# Patient Record
Sex: Male | Born: 1940 | Race: White | Hispanic: No | Marital: Married | Smoking: Former smoker
Health system: Southern US, Community
[De-identification: ages and names within clinical notes are randomized; demographics above are authoritative.]

## PROBLEM LIST (undated history)

## (undated) DIAGNOSIS — J449 Chronic obstructive pulmonary disease, unspecified: Secondary | ICD-10-CM

## (undated) DIAGNOSIS — Z86010 Personal history of colonic polyps: Secondary | ICD-10-CM

## (undated) DIAGNOSIS — R6889 Other general symptoms and signs: Secondary | ICD-10-CM

## (undated) DIAGNOSIS — Z9289 Personal history of other medical treatment: Secondary | ICD-10-CM

## (undated) DIAGNOSIS — R911 Solitary pulmonary nodule: Secondary | ICD-10-CM

## (undated) DIAGNOSIS — Z85828 Personal history of other malignant neoplasm of skin: Secondary | ICD-10-CM

## (undated) DIAGNOSIS — I10 Essential (primary) hypertension: Secondary | ICD-10-CM

## (undated) DIAGNOSIS — Z860101 Personal history of adenomatous and serrated colon polyps: Secondary | ICD-10-CM

## (undated) DIAGNOSIS — Z972 Presence of dental prosthetic device (complete) (partial): Secondary | ICD-10-CM

## (undated) DIAGNOSIS — R310 Gross hematuria: Secondary | ICD-10-CM

## (undated) DIAGNOSIS — I779 Disorder of arteries and arterioles, unspecified: Secondary | ICD-10-CM

## (undated) DIAGNOSIS — Z973 Presence of spectacles and contact lenses: Secondary | ICD-10-CM

## (undated) DIAGNOSIS — D494 Neoplasm of unspecified behavior of bladder: Secondary | ICD-10-CM

## (undated) DIAGNOSIS — I2510 Atherosclerotic heart disease of native coronary artery without angina pectoris: Secondary | ICD-10-CM

## (undated) DIAGNOSIS — E785 Hyperlipidemia, unspecified: Secondary | ICD-10-CM

## (undated) DIAGNOSIS — K08109 Complete loss of teeth, unspecified cause, unspecified class: Secondary | ICD-10-CM

## (undated) DIAGNOSIS — N35911 Unspecified urethral stricture, male, meatal: Secondary | ICD-10-CM

## (undated) DIAGNOSIS — Z955 Presence of coronary angioplasty implant and graft: Secondary | ICD-10-CM

## (undated) DIAGNOSIS — Z9889 Other specified postprocedural states: Secondary | ICD-10-CM

## (undated) DIAGNOSIS — K5730 Diverticulosis of large intestine without perforation or abscess without bleeding: Secondary | ICD-10-CM

## (undated) HISTORY — PX: VASECTOMY: SHX75

## (undated) HISTORY — DX: Essential (primary) hypertension: I10

## (undated) HISTORY — PX: COLONOSCOPY: SHX174

## (undated) HISTORY — DX: Hyperlipidemia, unspecified: E78.5

## (undated) HISTORY — PX: CARDIOVASCULAR STRESS TEST: SHX262

## (undated) HISTORY — PX: CORONARY ANGIOPLASTY: SHX604

## (undated) HISTORY — DX: Atherosclerotic heart disease of native coronary artery without angina pectoris: I25.10

## (undated) HISTORY — PX: CORONARY ANGIOPLASTY WITH STENT PLACEMENT: SHX49

---
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
## 1969-08-29 HISTORY — PX: APPENDECTOMY: SHX54

## 1999-03-30 DIAGNOSIS — Z955 Presence of coronary angioplasty implant and graft: Secondary | ICD-10-CM

## 1999-03-30 HISTORY — DX: Presence of coronary angioplasty implant and graft: Z95.5

## 1999-04-06 ENCOUNTER — Observation Stay (HOSPITAL_COMMUNITY): Admission: AD | Admit: 1999-04-06 | Discharge: 1999-04-07 | Payer: Self-pay | Admitting: Cardiology

## 1999-08-02 ENCOUNTER — Ambulatory Visit (HOSPITAL_COMMUNITY): Admission: RE | Admit: 1999-08-02 | Discharge: 1999-08-03 | Payer: Self-pay | Admitting: Cardiology

## 1999-10-01 ENCOUNTER — Encounter: Admission: RE | Admit: 1999-10-01 | Discharge: 1999-10-01 | Payer: Self-pay | Admitting: Internal Medicine

## 1999-10-01 ENCOUNTER — Encounter: Payer: Self-pay | Admitting: Internal Medicine

## 2003-02-07 ENCOUNTER — Encounter: Admission: RE | Admit: 2003-02-07 | Discharge: 2003-02-07 | Payer: Self-pay | Admitting: Internal Medicine

## 2003-02-07 ENCOUNTER — Encounter: Payer: Self-pay | Admitting: Internal Medicine

## 2006-02-13 ENCOUNTER — Ambulatory Visit: Admission: RE | Admit: 2006-02-13 | Discharge: 2006-02-13 | Payer: Self-pay | Admitting: Internal Medicine

## 2006-04-04 ENCOUNTER — Ambulatory Visit (HOSPITAL_COMMUNITY): Admission: RE | Admit: 2006-04-04 | Discharge: 2006-04-04 | Payer: Self-pay | Admitting: *Deleted

## 2006-04-04 ENCOUNTER — Encounter (INDEPENDENT_AMBULATORY_CARE_PROVIDER_SITE_OTHER): Payer: Self-pay | Admitting: *Deleted

## 2008-04-02 ENCOUNTER — Encounter: Admission: RE | Admit: 2008-04-02 | Discharge: 2008-04-02 | Payer: Self-pay | Admitting: Orthopedic Surgery

## 2008-05-13 ENCOUNTER — Ambulatory Visit: Payer: Self-pay | Admitting: Vascular Surgery

## 2010-05-02 ENCOUNTER — Emergency Department (HOSPITAL_COMMUNITY): Admission: EM | Admit: 2010-05-02 | Discharge: 2010-05-02 | Payer: Self-pay | Admitting: Emergency Medicine

## 2010-07-01 ENCOUNTER — Encounter (INDEPENDENT_AMBULATORY_CARE_PROVIDER_SITE_OTHER): Payer: Self-pay | Admitting: *Deleted

## 2010-07-05 ENCOUNTER — Telehealth: Payer: Self-pay | Admitting: Gastroenterology

## 2010-07-06 ENCOUNTER — Encounter (INDEPENDENT_AMBULATORY_CARE_PROVIDER_SITE_OTHER): Payer: Self-pay | Admitting: *Deleted

## 2010-07-27 ENCOUNTER — Encounter (INDEPENDENT_AMBULATORY_CARE_PROVIDER_SITE_OTHER): Payer: Self-pay | Admitting: *Deleted

## 2010-07-28 ENCOUNTER — Ambulatory Visit: Payer: Self-pay | Admitting: Internal Medicine

## 2010-07-29 ENCOUNTER — Telehealth: Payer: Self-pay | Admitting: Internal Medicine

## 2010-08-12 ENCOUNTER — Ambulatory Visit: Payer: Self-pay | Admitting: Internal Medicine

## 2010-09-21 ENCOUNTER — Other Ambulatory Visit: Payer: Self-pay | Admitting: Internal Medicine

## 2010-09-21 DIAGNOSIS — D126 Benign neoplasm of colon, unspecified: Secondary | ICD-10-CM

## 2010-09-27 ENCOUNTER — Encounter: Payer: Self-pay | Admitting: Internal Medicine

## 2010-09-28 NOTE — Progress Notes (Signed)
Summary: previsit letter ?'s  Phone Note Call from Patient Call back at Home Phone [phone]   Caller: Patient Call For: Dr. Christella Hartigan Reason for Call: Talk to Nurse Summary of Call: previsit letter ?'s Initial call taken by: Vallarie Mare,  July 05, 2010 8:29 AM  Follow-up for Phone Call        Dr Christella Hartigan the pt had a colon with Dr Virginia Rochester 04/04/06.  he is scheduled for a recall.  I have pasted the colon and path for your review.  Should the pt stay on the schedule? Follow-up by: Chales Abrahams CMA Duncan Dull),  July 05, 2010 8:36 AM  Additional Follow-up for Phone Call Additional follow up Details #1::        Dr. Virginia Rochester did not state how big any of the polyps were.   Neither does path report.  Guidelines show could be 5 years, could be 3 years (based on size of polyp removed).  I think spliting the difference at 4 years (around now) is reasonable, so go ahead with pre-visit as planned. Additional Follow-up by: Rachael Fee MD,  July 05, 2010 9:00 AM    Additional Follow-up for Phone Call Additional follow up Details #2::    pt aware Follow-up by: Chales Abrahams CMA Duncan Dull),  July 05, 2010 9:41 AM

## 2010-09-28 NOTE — Letter (Signed)
Summary: Pre Visit No Show Letter  [facility]  [address] [place], [place] [address]   Phone: [phone]  Fax: [phone]        July 06, 2010 MRN: [id number]    Jordan Chambers [address] [place], [place]  [address]    Dear [name],   We have been unable to reach you by phone concerning the pre-procedure visit that you missed on Tuesday 07/06/2010. For this reason,your procedure scheduled on Wednesday 07/14/2010 has been cancelled. Our scheduling staff will gladly assist you with rescheduling your appointments at a more convenient time. Please call our office at [phone] between the hours of 8:00am and 5:00pm, press option #2 to reach an appointment scheduler. Please consider updating your contact numbers at this time so that we can reach you by phone in the future with schedule changes or results.    Thank you,    Ezra Sites RN [facility]

## 2010-09-28 NOTE — Miscellaneous (Signed)
Summary: [facility] Previsit/prep  Clinical Lists Changes  Medications: Added new medication of MOVIPREP 100 GM  SOLR (PEG-KCL-NACL-NASULF-NA ASC-C) As per prep instructions. - Signed Rx of MOVIPREP 100 GM  SOLR (PEG-KCL-NACL-NASULF-NA ASC-C) As per prep instructions.;  #1 x 0;  Signed;  Entered by: Wyona Almas RN;  Authorized by: Hilarie Fredrickson MD;  Method used: Electronically to [id number] [address] [place], [place], [place]  [address], Ph: [phone] or [phone], Fax: [phone] Observations: Added new observation of NKA: T (07/28/2010 8:46)    Prescriptions: MOVIPREP 100 GM  SOLR (PEG-KCL-NACL-NASULF-NA ASC-C) As per prep instructions.  #1 x 0   Entered by:   Wyona Almas RN   Authorized by:   Hilarie Fredrickson MD   Signed by:   Wyona Almas RN on 07/28/2010   Method used:   Electronically to        [facility] [phone]* (retail)       [address]       [place], [place]  [address]       Ph: [phone] or [phone]       Fax: [phone]   RxID:   [id number]

## 2010-09-28 NOTE — Progress Notes (Signed)
Summary: Colon with Dr. Virginia Rochester  ---- Converted from flag ---- ---- 07/29/2010 3:12 PM, Milford Cage NCMA wrote: Pt. had colonoscopy by Dr. Virginia Rochester in 2007 and direct colon was set up for 08/12/10 with Dr. Marina Goodell.   Dr. Marina Goodell said to keep as a direct. ------------------------------

## 2010-09-28 NOTE — Procedures (Signed)
Summary: Colon   Colonoscopy  Procedure date:  04/04/2006  Findings:      Location:  [facility].   Jordan Chambers, Jordan Chambers                   ACCOUNT NO.:  [id number]   MEDICAL RECORD NO.:  [id number]          PATIENT TYPE:  AMB   LOCATION:  ENDO                         FACILITY:  [facility]   PHYSICIAN:  Georgiana Spinner, M.D.    DATE OF BIRTH:  [birth year]   DATE OF PROCEDURE:  04/04/2006  DATE OF DISCHARGE:                                 OPERATIVE REPORT   PROCEDURE:  Colonoscopy.   INDICATIONS:  Colon polyps.   ANESTHESIA:  Fentanyl 75 mcg, Versed 6 mg.   PROCEDURE:  With the patient mildly sedated in the left lateral decubitus  position, the Olympus videoscopic colonoscope was inserted in the rectum  after normal rectal exam and passed under direct vision to the cecum  identified by ileocecal valve and appendiceal orifice both which were  photographed.  We were able to easily enter into the terminal ileum which  was photographed.  From this point, colonoscope was slowly withdrawn taking  circumferential views of colonic mucosa, stopping in the hepatic flexure  area, first where a polyp was seen, photographed and removed using hot  biopsy forceps technique - setting of 20/200 blended current.  We next  stopped at the descending colon where two polyps were seen.  One was removed  using again the hot biopsy forceps technique; the other with snare cautery  technique with the same setting.  All of the tissue was retrieved for  pathology.  We then next stopped at the rectum which appeared normal on  direct and showed hemorrhoids on retroflexed view.  The endoscope was  straightened and withdrawn.  The patient's vital signs and pulse oximeter  remained stable.  The patient tolerated the procedure well and without  apparent complications.   FINDINGS:  Polyps as described above.  Internal hemorrhoids.  Otherwise  unremarkable colonoscopic examination to cecum.   PLAN:  Await  biopsy report.  The patient will call me for results and follow-  up with me as an outpatient.           ______________________________  Georgiana Spinner, M.D.     GMO/MEDQ  D:  04/04/2006  T:  04/04/2006  Job:  [id number]

## 2010-09-28 NOTE — Letter (Signed)
Summary: [facility] Instructions  [facility]  [address] [place], [place] [address]   Phone: [phone]  Fax: [phone]       Jordan Chambers    01/19/1941    MRN: [id number]        Procedure Day Dorna Bloom:  Lenor Coffin 12/15/111     Arrival Time:  7:30am     Procedure Time:  8:30am     Location of Procedure:                    _ X_  [facility] (4th Floor)                       PREPARATION FOR COLONOSCOPY WITH MOVIPREP   Starting 5 days prior to your procedure  SATURDAY 12/10  do not eat nuts, seeds, popcorn, corn, beans, peas,  salads, or any raw vegetables.  Do not take any fiber supplements (e.g. Metamucil, Citrucel, and Benefiber).  THE DAY BEFORE YOUR PROCEDURE         DATE: [facility] 12/14  1.  Drink clear liquids the entire day-NO SOLID FOOD  2.  Do not drink anything colored red or purple.  Avoid juices with pulp.  No orange juice.  3.  Drink at least 64 oz. (8 glasses) of fluid/clear liquids during the day to prevent dehydration and help the prep work efficiently.  CLEAR LIQUIDS INCLUDE: Water Jello Ice Popsicles Tea (sugar ok, no milk/cream) Powdered fruit flavored drinks Coffee (sugar ok, no milk/cream) Gatorade Juice: apple, white grape, white cranberry  Lemonade Clear bullion, consomm, broth Carbonated beverages (any kind) Strained chicken noodle soup Hard Candy                             4.  In the morning, mix first dose of MoviPrep solution:    Empty 1 Pouch A and 1 Pouch B into the disposable container    Add lukewarm drinking water to the top line of the container. Mix to dissolve    Refrigerate (mixed solution should be used within 24 hrs)  5.  Begin drinking the prep at 5:00 p.m. The MoviPrep container is divided by 4 marks.   Every 15 minutes drink the solution down to the next mark (approximately 8 oz) until the full liter is complete.   6.  Follow completed prep with 16 oz of clear liquid of your choice (Nothing  red or purple).  Continue to drink clear liquids until bedtime.  7.  Before going to bed, mix second dose of MoviPrep solution:    Empty 1 Pouch A and 1 Pouch B into the disposable container    Add lukewarm drinking water to the top line of the container. Mix to dissolve    Refrigerate  THE DAY OF YOUR PROCEDURE      DATE: THURSDAY 12/15  Beginning at  3:30 a.m. (5 hours before procedure):         1. Every 15 minutes, drink the solution down to the next mark (approx 8 oz) until the full liter is complete.  2. Follow completed prep with 16 oz. of clear liquid of your choice.    3. You may drink clear liquids until  6:30am  (2 HOURS BEFORE PROCEDURE).   MEDICATION INSTRUCTIONS  Unless otherwise instructed, you should take regular prescription medications with a small sip of water   as early as possible the  morning of your procedure.  Additional medication instructions: Hold Hydrochlorothyazide the morning of procedure.         OTHER INSTRUCTIONS  You will need a responsible adult at least 72 years of age to accompany you and drive you home.   This person must remain in the waiting room during your procedure.  Wear loose fitting clothing that is easily removed.  Leave jewelry and other valuables at home.  However, you may wish to bring a book to read or  an iPod/MP3 player to listen to music as you wait for your procedure to start.  Remove all body piercing jewelry and leave at home.  Total time from sign-in until discharge is approximately 2-3 hours.  You should go home directly after your procedure and rest.  You can resume normal activities the  day after your procedure.  The day of your procedure you should not:   Drive   Make legal decisions   Operate machinery   Drink alcohol   Return to work  You will receive specific instructions about eating, activities and medications before you leave.    The above instructions have been reviewed and explained  to me by   Wyona Almas RN  July 28, 2010 9:38 AM     I fully understand and can verbalize these instructions _____________________________ Date _________

## 2010-09-28 NOTE — Letter (Signed)
Summary: Pre Visit Letter Revised  [facility]  [address] [place], [place] [address]   Phone: [phone]  Fax: [phone]        07/01/2010 MRN: [id number] Jordan Chambers [address] [place], [place]  [address]             Procedure Date:  07/14/2010   Welcome to the Gastroenterology Division at [facility].    You are scheduled to see a nurse for your pre-procedure visit on 07/06/2010 at 2:00PM on the 3rd floor at [facility], [address]  We ask that you try to arrive at our office 15 minutes prior to your appointment time to allow for check-in.  Please take a minute to review the attached form.  If you answer "Yes" to one or more of the questions on the first page, we ask that you call the person listed at your earliest opportunity.  If you answer "No" to all of the questions, please complete the rest of the form and bring it to your appointment.    Your nurse visit will consist of discussing your medical and surgical history, your immediate family medical history, and your medications.   If you are unable to list all of your medications on the form, please bring the medication bottles to your appointment and we will list them.  We will need to be aware of both prescribed and over the counter drugs.  We will need to know exact dosage information as well.    Please be prepared to read and sign documents such as consent forms, a financial agreement, and acknowledgement forms.  If necessary, and with your consent, a friend or relative is welcome to sit-in on the nurse visit with you.  Please bring your insurance card so that we may make a copy of it.  If your insurance requires a referral to see a specialist, please bring your referral form from your primary care physician.  No co-pay is required for this nurse visit.     If you cannot keep your appointment, please call [phone] to cancel or reschedule prior to your appointment date.  This allows [place] the  opportunity to schedule an appointment for another patient in need of care.    Thank you for choosing [facility] for your medical needs.  We appreciate the opportunity to care for you.  Please visit [place] at our website  to learn more about our practice.  Sincerely, The Gastroenterology Division

## 2010-09-30 NOTE — Procedures (Addendum)
Summary: Colonoscopy  Patient: Jordan Chambers Note: All result statuses are Final unless otherwise noted.  Tests: (1) Colonoscopy (COL)   COL Colonoscopy           DONE     [facility]     [address]     [place], [place]  [address]           COLONOSCOPY PROCEDURE REPORT           PATIENT:  Jordan Chambers, Jordan Chambers  MR#:  [id number]     BIRTHDATE:  05/01/1941, 69 yrs. old  GENDER:  male     ENDOSCOPIST:  Wilhemina Bonito. Eda Keys, MD     REF. BY:  Soyla Murphy. Renne Crigler, M.D.     PROCEDURE DATE:  09/21/2010     PROCEDURE:  Colonoscopy with snare polypectomy x 7     ASA CLASS:  Class II     INDICATIONS:  history of adenomatous polyps, surveillance and     high-risk screening ; Repeorts multiple prior colonoscopies with     Dr. Virginia Rochester. Last one 03-2006 with adenomatous polyp     MEDICATIONS:   Fentanyl 75 mcg IV, Versed 7 mg IV           DESCRIPTION OF PROCEDURE:   After the risks benefits and     alternatives of the procedure were thoroughly explained, informed     consent was obtained.  Digital rectal exam was performed and     revealed no abnormalities.   The LB CF-[id number] endoscope     was introduced through the anus and advanced to the cecum, which     was identified by both the appendix and ileocecal valve, without     limitations.Time to cecum = 3:09 min. The quality of the prep was     good, using MoviPrep.  The instrument was then slowly withdrawn     (time = 18:20 min) as the colon was fully examined.     <<PROCEDUREIMAGES>>           FINDINGS:  There were SEVEN polyps, measuring between 3mm and 8mm,     identified and removed from the ascending colon to sigmoid colon.     Polyps were snared without cautery. Retrieval was successful.     snare polyp  Moderate diverticulosis was found in the sigmoid     colon.  Otherwise normal colonoscopy without other polyps, masses,     vascular ectasias, or inflammatory changes.   Retroflexed views in     the rectum revealed internal hemorrhoids.     The scope was then     withdrawn from the patient and the procedure completed.           COMPLICATIONS:  None     ENDOSCOPIC IMPRESSION:     1) Polyps, multiple ascending colon to sigmoid colon - removed     2) Moderate diverticulosis in the sigmoid colon     3) Otherwise nl colonoscopy     4) Internal hemorrhoids           RECOMMENDATIONS:     1) Repeat Colonoscopy in 3 years.           ______________________________     Wilhemina Bonito. Eda Keys, MD           CC:  Romero Liner, MD;  The Patient           n.     eSIGNED:   Wilhemina Bonito. Marina Goodell  Jr at 09/21/2010 10:09 AM           Netta Neat, [id number]  Note: An exclamation mark (!) indicates a result that was not dispersed into the flowsheet. Document Creation Date: 09/21/2010 10:09 AM _______________________________________________________________________  (1) Order result status: Final Collection or observation date-time: 09/21/2010 09:57 Requested date-time:  Receipt date-time:  Reported date-time:  Referring Physician:   Ordering Physician: Fransico Setters [phone]) Specimen Source:  Source: Launa Grill Order Number: [id number] Lab site:   Appended Document: Colonoscopy recall 3 yrs     Procedures Next Due Date:    Colonoscopy: 08/2013

## 2010-10-06 NOTE — Letter (Signed)
Summary: Patient Notice- Polyp Results  [facility]  [address] [place], [place] [address]   Phone: [phone]  Fax: [phone]        September 27, 2010 MRN: [id number]    Jordan Chambers [address] [place], [place]  [address]    Dear [name],  I am pleased to inform you that the colon polyp(s) removed during your recent colonoscopy was (were) found to be benign (no cancer detected) upon pathologic examination.  I recommend you have a repeat colonoscopy examination in 3 years to look for recurrent polyps, as having colon polyps increases your risk for having recurrent polyps or even colon cancer in the future.  Should you develop new or worsening symptoms of abdominal pain, bowel habit changes or bleeding from the rectum or bowels, please schedule an evaluation with either your primary care physician or with me.    Additional information/recommendations:  __ No further action with gastroenterology is needed at this time. Please      follow-up with your primary care physician for your other healthcare      needs.   Please call us if you are having persistent problems or have questions about your condition that have not been fully answered at this time.  Sincerely,  Hilarie Fredrickson MD  This letter has been electronically signed by your physician.  Appended Document: Patient Notice- Polyp Results LETTER MAILED

## 2010-11-11 LAB — URINALYSIS, ROUTINE W REFLEX MICROSCOPIC
Bilirubin Urine: NEGATIVE
Nitrite: NEGATIVE
Protein, ur: NEGATIVE mg/dL
Specific Gravity, Urine: 1.025 (ref 1.005–1.030)
Urobilinogen, UA: 1 mg/dL (ref 0.0–1.0)

## 2010-11-11 LAB — URINE MICROSCOPIC-ADD ON

## 2010-11-11 LAB — BASIC METABOLIC PANEL
CO2: 28 mEq/L (ref 19–32)
Chloride: 107 mEq/L (ref 96–112)
Creatinine, Ser: 0.74 mg/dL (ref 0.4–1.5)
GFR calc Af Amer: >60 mL/min (ref >60)
Potassium: 3.5 mEq/L (ref 3.5–5.1)

## 2010-11-11 LAB — DIFFERENTIAL
Basophils Absolute: 0 10*3/uL (ref 0.0–0.1)
Basophils Relative: 0 % (ref 0–1)
Eosinophils Absolute: 0.2 10*3/uL (ref 0.0–0.7)
Eosinophils Relative: 2 % (ref 0–5)
Monocytes Absolute: 1 10*3/uL (ref 0.1–1.0)
Neutro Abs: 5.6 10*3/uL (ref 1.7–7.7)

## 2010-11-11 LAB — CBC
HCT: 40.8 % (ref 39.0–52.0)
MCH: 29.8 pg (ref 26.0–34.0)
MCV: 84.5 fL (ref 78.0–100.0)
Platelets: 186 10*3/uL (ref 150–400)
RBC: 4.83 MIL/uL (ref 4.22–5.81)
WBC: 8.8 10*3/uL (ref 4.0–10.5)

## 2011-01-11 NOTE — Consult Note (Signed)
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  He is referred by Dr. August Chambers.  This is a  pleasant 70 year old gentleman who is having some right knee pain.  He  was seen by Dr. August Chambers and was noted to have diminished pedal pulses on  the right.  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  He was sent for vascular  consultation.  Of note, the patient is fairly active but denies any  history of claudication, rest pain, or nonhealing ulcers.  His only  complaint has been some right knee pain.  Since this was injected by Dr.  August Chambers, his symptoms have essentially resolved.   PAST MEDICAL HISTORY:  Significant for hypertension.  He also underwent  previous PTCA by Dr. Aleen Chambers.  He denies any history of diabetes,  hypercholesterolemia, history of previous myocardial infarction, history  of congestive heart failure, or history of COPD.   FAMILY HISTORY:  He was adopted and does not know his family history.   SOCIAL HISTORY:  He is married and has 2 children.  Smokes 1 pack per  day of cigarettes.  Has been smoking for over 40 years.   REVIEW OF SYSTEMS AND MEDICATIONS:  Documented on the medical history  form in his chart.   PHYSICAL EXAMINATION:  This is a pleasant 70 year old gentleman who  appears his stated age.  Blood pressure is 175/92, heart rate is 71.  Neck is supple.  There is no cervical lymphadenopathy.  I do not detect  any carotid bruits.  Lungs are clear bilaterally to auscultation.  Cardiac exam he has a regular rate and rhythm.  Abdomen:  Soft and  nontender.  I cannot palpate an aneurysm.  Normal pitched bowel sounds.  He has palpable femoral  pulses.  On the right side I cannot palpate a  popliteal or posterior tibial pulse although I cannot palpate a  diminished right dorsalis pedis pulse.  On the left side he has a  palpable popliteal, dorsalis pedis and posterior tibial pulse.  No  ischemic ulcers on his feet.  He has no significant lower extremity  swelling.  Neurologic:  Exam is nonfocal.   I reviewed his Doppler study that was done [facility].  He has triphasic femoral wave forms bilateral with monophasic  popliteal and tibial signals on the right and an ABI of 57%.  On the  left side he has triphasic waveforms throughout.  Of note, the study was  done on 04/02/2008.   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  However, he is asymptomatic and  I would not recommend any further evaluation unless he developed  disabling claudication, rest pain, or nonhealing ulcer.  He apparently  developed extensive collaterals given that he has a diminished but  palpable dorsalis pedis pulse in the right.  We have discussed the  importance of tobacco cessation and Dr. Renne Chambers has been working with him  on this also, although currently he does not seem highly motivated for  this.  We have also discussed the importance of remaining as active as  possible and specifically doing as much walking as possible.   I will see him back p.r.n.   Jordan Chambers. Jordan Chambers, M.D.  Electronically Signed  CSD/MEDQ  D:  05/13/2008  Chambers:  05/14/2008  Job:  [id number]   cc:   Jordan Chambers, M.D.  Jordan Chambers. Jordan Chambers, M.D.

## 2011-01-14 NOTE — Op Note (Signed)
Jordan Chambers, Jordan Chambers                   ACCOUNT NO.:  [id number]   MEDICAL RECORD NO.:  [id number]          PATIENT TYPE:  AMB   LOCATION:  ENDO                         FACILITY:  [facility]   PHYSICIAN:  Georgiana Spinner, M.D.    DATE OF BIRTH:  Sep 12, 1940   DATE OF PROCEDURE:  04/04/2006  DATE OF DISCHARGE:                                 OPERATIVE REPORT   PROCEDURE:  Colonoscopy.   INDICATIONS:  Colon polyps.   ANESTHESIA:  Fentanyl 75 mcg, Versed 6 mg.   PROCEDURE:  With the patient mildly sedated in the left lateral decubitus  position, the Olympus videoscopic colonoscope was inserted in the rectum  after normal rectal exam and passed under direct vision to the cecum  identified by ileocecal valve and appendiceal orifice both which were  photographed.  We were able to easily enter into the terminal ileum which  was photographed.  From this point, colonoscope was slowly withdrawn taking  circumferential views of colonic mucosa, stopping in the hepatic flexure  area, first where a polyp was seen, photographed and removed using hot  biopsy forceps technique - setting of 20/200 blended current.  We next  stopped at the descending colon where two polyps were seen.  One was removed  using again the hot biopsy forceps technique; the other with snare cautery  technique with the same setting.  All of the tissue was retrieved for  pathology.  We then next stopped at the rectum which appeared normal on  direct and showed hemorrhoids on retroflexed view.  The endoscope was  straightened and withdrawn.  The patient's vital signs and pulse oximeter  remained stable.  The patient tolerated the procedure well and without  apparent complications.   FINDINGS:  Polyps as described above.  Internal hemorrhoids.  Otherwise  unremarkable colonoscopic examination to cecum.   PLAN:  Await biopsy report.  The patient will call me for results and follow-  up with me as an outpatient.     ______________________________  Georgiana Spinner, M.D.     GMO/MEDQ  D:  04/04/2006  T:  04/04/2006  Job:  [id number]

## 2012-08-08 ENCOUNTER — Ambulatory Visit (HOSPITAL_COMMUNITY)
Admission: RE | Admit: 2012-08-08 | Discharge: 2012-08-08 | Disposition: A | Discharge status: Home / Self Care | Payer: Medicare Other | Source: Ambulatory Visit | Attending: Internal Medicine | Admitting: Internal Medicine

## 2012-08-08 ENCOUNTER — Other Ambulatory Visit (HOSPITAL_COMMUNITY): Payer: Self-pay | Admitting: Internal Medicine

## 2012-08-08 DIAGNOSIS — M7989 Other specified soft tissue disorders: Secondary | ICD-10-CM

## 2012-08-08 DIAGNOSIS — R599 Enlarged lymph nodes, unspecified: Secondary | ICD-10-CM | POA: Insufficient documentation

## 2012-08-08 NOTE — Progress Notes (Addendum)
VASCULAR LAB PRELIMINARY  PRELIMINARY  PRELIMINARY  PRELIMINARY Left lower extremity venous duplex completed.    Preliminary report: Left lower extremity is negative for deep and superficial vein thrombosis.  There is an enlarged lymph node seen in left groin.  Jeremy Mclamb, RVT 08/08/2012, 5:52 PM

## 2013-01-18 ENCOUNTER — Other Ambulatory Visit: Payer: Self-pay | Admitting: Dermatology

## 2013-02-13 ENCOUNTER — Institutional Professional Consult (permissible substitution): Payer: Medicare Other | Admitting: Pulmonary Disease

## 2013-09-11 ENCOUNTER — Encounter: Payer: Self-pay | Admitting: Internal Medicine

## 2014-02-04 ENCOUNTER — Encounter: Payer: Self-pay | Admitting: Internal Medicine

## 2014-02-10 ENCOUNTER — Encounter: Payer: Self-pay | Admitting: Internal Medicine

## 2014-02-25 ENCOUNTER — Ambulatory Visit: Payer: Medicare Other | Admitting: Sports Medicine

## 2014-03-03 ENCOUNTER — Ambulatory Visit
Admission: RE | Admit: 2014-03-03 | Discharge: 2014-03-03 | Disposition: A | Discharge status: Home / Self Care | Payer: Medicare Other | Source: Ambulatory Visit | Attending: Sports Medicine | Admitting: Sports Medicine

## 2014-03-03 ENCOUNTER — Encounter: Payer: Self-pay | Admitting: Sports Medicine

## 2014-03-03 ENCOUNTER — Ambulatory Visit (INDEPENDENT_AMBULATORY_CARE_PROVIDER_SITE_OTHER): Payer: Medicare Other | Admitting: Sports Medicine

## 2014-03-03 VITALS — BP 148/78 | Ht 69.0 in | Wt 185.0 lb

## 2014-03-03 DIAGNOSIS — M1610 Unilateral primary osteoarthritis, unspecified hip: Secondary | ICD-10-CM

## 2014-03-03 DIAGNOSIS — M1611 Unilateral primary osteoarthritis, right hip: Secondary | ICD-10-CM

## 2014-03-03 DIAGNOSIS — M79609 Pain in unspecified limb: Secondary | ICD-10-CM

## 2014-03-03 DIAGNOSIS — M79604 Pain in right leg: Secondary | ICD-10-CM

## 2014-03-03 DIAGNOSIS — M169 Osteoarthritis of hip, unspecified: Secondary | ICD-10-CM | POA: Insufficient documentation

## 2014-03-03 NOTE — Progress Notes (Signed)
   Subjective:    Patient ID: Jordan Chambers, male    DOB: 07-07-1941, 73 y.o.   MRN: [id number]  HPI chief complaint: Right thigh pain  Very pleasant 73 year old male comes in today at the request of Dr.Pharr for evaluation of right thigh pain. No trauma that he can recall but rather sudden onset of pain that he describes as aching in quality. It involves the entire thigh with occasional radiating pain up into the groin. It is most noticeable with activities such as putting on his socks and shoes. Some pain in the posterior hip as well. He denies any associated numbness or tingling down the leg. No pain past his knee. His leg does occasionally want to give way. No prior hip surgery. He takes intermittent doses of over-the-counter ibuprofen with good symptom relief.  Past medical history and current medications review Allergies reviewed     Review of Systems    as above Objective:   Physical Exam Well-developed, well-nourished. No acute distress. Awake alert and oriented x3. Vital signs reviewed.  Right hip: Limited internal and external rotation passively when compared to the uninvolved left hip. No pain with log roll testing but there is reproducible pain with resisted hip flexion on the right. Negative straight leg raise. No tenderness to palpation over the greater trochanteric bursa. Reflexes are brisk and equal at the patellar and Achilles reflexes bilaterally. Strength is 5/5 bilaterally. No atrophy. Patient walks with a slightly externally rotated right foot.  X-rays of his pelvis and right hip are reviewed. He has moderate joint space narrowing of the right hip consistent with DJD. Nothing acute.       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 Patient's symptoms are currently tolerable with over-the-counter ibuprofen. He is taking it only on an as-needed basis. If symptoms worsen and pain warrants we  could consider an intra-articular cortisone injection down the road. Patient would like to wait on that for now. Followup when necessary.

## 2014-03-19 ENCOUNTER — Ambulatory Visit (AMBULATORY_SURGERY_CENTER): Payer: Self-pay | Admitting: *Deleted

## 2014-03-19 VITALS — Ht 69.0 in | Wt 196.2 lb

## 2014-03-19 DIAGNOSIS — Z86010 Personal history of colonic polyps: Secondary | ICD-10-CM

## 2014-03-19 MED ORDER — MOVIPREP 100 G PO SOLR: ORAL | Status: DC

## 2014-03-19 NOTE — Progress Notes (Signed)
No allergies to eggs or soy. No problems with anesthesia.  Pt given Emmi instructions for colonoscopy  No oxygen use  No diet drug use  

## 2014-04-02 ENCOUNTER — Encounter: Payer: Self-pay | Admitting: Internal Medicine

## 2014-04-02 ENCOUNTER — Ambulatory Visit (AMBULATORY_SURGERY_CENTER): Payer: Medicare Other | Admitting: Internal Medicine

## 2014-04-02 VITALS — BP 106/58 | HR 75 | Temp 96.7°F | Resp 17 | Ht 69.0 in | Wt 196.0 lb

## 2014-04-02 DIAGNOSIS — D126 Benign neoplasm of colon, unspecified: Secondary | ICD-10-CM

## 2014-04-02 DIAGNOSIS — Z86010 Personal history of colonic polyps: Secondary | ICD-10-CM

## 2014-04-02 MED ORDER — SODIUM CHLORIDE 0.9 % IV SOLN: 500.0000 mL | INTRAVENOUS | Status: DC

## 2014-04-02 NOTE — Progress Notes (Signed)
Report to PACU, RN, vss, BBS= Clear.  

## 2014-04-02 NOTE — Progress Notes (Signed)
Called to room to assist during endoscopic procedure.  Patient ID and intended procedure confirmed with present staff. Received instructions for my participation in the procedure from the performing physician.  

## 2014-04-02 NOTE — Op Note (Signed)
[employer]  [address] [place], [id number]   COLONOSCOPY PROCEDURE REPORT  PATIENT: Jordan Chambers, Jordan Chambers  MR#: [id number] BIRTHDATE: [birth year] , 72  yrs. old GENDER: Male ENDOSCOPIST: Eustace Quail, MD REFERRED GY:[id number] Program Recall PROCEDURE DATE:  04/02/2014 PROCEDURE:   Colonoscopy with snare polypectomy x 1 First Screening Colonoscopy - Avg.  risk and is 50 yrs.  old or older - No.  Prior Negative Screening - Now for repeat screening. N/A  History of Adenoma - Now for follow-up colonoscopy & has been > or = to 3 yrs.  Yes hx of adenoma.  Has been 3 or more years since last colonoscopy.  Polyps Removed Today? Yes. ASA CLASS:   Class III INDICATIONS:Patient's personal history of adenomatous colon polyps. Index 2007 (Dr Lajoyce Corners; TA); Last 08-2010 (6 adenomas  -  < 26mm). MEDICATIONS: MAC sedation, administered by CRNA and propofol (Diprivan) 280mg  IV  DESCRIPTION OF PROCEDURE:   After the risks benefits and alternatives of the procedure were thoroughly explained, informed consent was obtained.  A digital rectal exam revealed no abnormalities of the rectum.   The LB [id number]  endoscope was introduced through the anus and advanced to the cecum, which was identified by both the appendix and ileocecal valve. No adverse events experienced.   The quality of the prep was excellent, using MoviPrep  The instrument was then slowly withdrawn as the colon was fully examined.  COLON FINDINGS: A diminutive polyp was found in the transverse colon.  A polypectomy was performed with a cold snare.  The resection was complete and the polyp tissue was completely retrieved.   Moderate diverticulosis was noted in the sigmoid colon.   The colon mucosa was otherwise normal.  Retroflexed views revealed internal hemorrhoids. The time to cecum=2 minutes 18 seconds.  Withdrawal time=10 minutes 21 seconds.  The scope was withdrawn and the procedure completed. COMPLICATIONS:  There were no complications.  ENDOSCOPIC IMPRESSION: 1.   Diminutive polyp was found in the transverse colon; polypectomy was performed with a cold snare 2.   Moderate diverticulosis was noted in the sigmoid colon 3.   The colon mucosa was otherwise normal  RECOMMENDATIONS: 1. Return to the care of your primary provider.  GI follow up as needed   eSigned:  Eustace Quail, MD 04/02/2014 1:35 PM   cc: Alden Server, MD and The Patient

## 2014-04-02 NOTE — Patient Instructions (Signed)
YOU HAD AN ENDOSCOPIC PROCEDURE TODAY AT THE [facility]: Refer to the procedure report that was given to you for any specific questions about what was found during the examination.  If the procedure report does not answer your questions, please call your gastroenterologist to clarify.  If you requested that your care partner not be given the details of your procedure findings, then the procedure report has been included in a sealed envelope for you to review at your convenience later.  YOU SHOULD EXPECT: Some feelings of bloating in the abdomen. Passage of more gas than usual.  Walking can help get rid of the air that was put into your GI tract during the procedure and reduce the bloating. If you had a lower endoscopy (such as a colonoscopy or flexible sigmoidoscopy) you may notice spotting of blood in your stool or on the toilet paper. If you underwent a bowel prep for your procedure, then you may not have a normal bowel movement for a few days.  DIET: Your first meal following the procedure should be a light meal and then it is ok to progress to your normal diet.  A half-sandwich or bowl of soup is an example of a good first meal.  Heavy or fried foods are harder to digest and may make you feel nauseous or bloated.  Likewise meals heavy in dairy and vegetables can cause extra gas to form and this can also increase the bloating.  Drink plenty of fluids but you should avoid alcoholic beverages for 24 hours.  ACTIVITY: Your care partner should take you home directly after the procedure.  You should plan to take it easy, moving slowly for the rest of the day.  You can resume normal activity the day after the procedure however you should NOT DRIVE or use heavy machinery for 24 hours (because of the sedation medicines used during the test).    SYMPTOMS TO REPORT IMMEDIATELY: A gastroenterologist can be reached at any hour.  During normal business hours, 8:30 AM to 5:00 PM Monday through Friday,  call [phone].  After hours and on weekends, please call the GI answering service at [phone] who will take a message and have the physician on call contact you.   Following lower endoscopy (colonoscopy or flexible sigmoidoscopy):  Excessive amounts of blood in the stool  Significant tenderness or worsening of abdominal pains  Swelling of the abdomen that is new, acute  Fever of 100F or higher   FOLLOW UP: If any biopsies were taken you will be contacted by phone or by letter within the next 1-3 weeks.  Call your gastroenterologist if you have not heard about the biopsies in 3 weeks.  Our staff will call the home number listed on your records the next business day following your procedure to check on you and address any questions or concerns that you may have at that time regarding the information given to you following your procedure. This is a courtesy call and so if there is no answer at the home number and we have not heard from you through the emergency physician on call, we will assume that you have returned to your regular daily activities without incident.  SIGNATURES/CONFIDENTIALITY: You and/or your care partner have signed paperwork which will be entered into your electronic medical record.  These signatures attest to the fact that that the information above on your After Visit Summary has been reviewed and is understood.  Full responsibility of the confidentiality of   this discharge information lies with you and/or your care-partner.   Resume medications. Information given on polyps,diverticulosis and high fiber diet with discharge instructions. 

## 2014-04-03 ENCOUNTER — Telehealth: Payer: Self-pay | Admitting: *Deleted

## 2014-04-03 NOTE — Telephone Encounter (Signed)
  Follow up Call-  Call back number 04/02/2014  Post procedure Call Back phone  # [phone]  Permission to leave phone message Yes     Patient questions:  Do you have a fever, pain , or abdominal swelling? No. Pain Score  0 *  Have you tolerated food without any problems? Yes.    Have you been able to return to your normal activities? Yes.    Do you have any questions about your discharge instructions: Diet   No. Medications  No. Follow up visit  No.  Do you have questions or concerns about your Care? No.  Actions: * If pain score is 4 or above: No action needed, pain <4.

## 2014-04-15 ENCOUNTER — Encounter: Payer: Self-pay | Admitting: Internal Medicine

## 2015-04-23 ENCOUNTER — Encounter: Payer: Self-pay | Admitting: Cardiovascular Disease

## 2016-03-29 DIAGNOSIS — J069 Acute upper respiratory infection, unspecified: Secondary | ICD-10-CM | POA: Diagnosis not present

## 2016-03-29 DIAGNOSIS — J449 Chronic obstructive pulmonary disease, unspecified: Secondary | ICD-10-CM | POA: Diagnosis not present

## 2016-04-07 DIAGNOSIS — H5203 Hypermetropia, bilateral: Secondary | ICD-10-CM | POA: Diagnosis not present

## 2016-06-29 ENCOUNTER — Other Ambulatory Visit: Payer: Self-pay | Admitting: Internal Medicine

## 2016-06-29 ENCOUNTER — Ambulatory Visit
Admission: RE | Admit: 2016-06-29 | Discharge: 2016-06-29 | Disposition: A | Discharge status: Home / Self Care | Payer: Medicare Other | Source: Ambulatory Visit | Attending: Internal Medicine | Admitting: Internal Medicine

## 2016-06-29 ENCOUNTER — Telehealth: Payer: Self-pay | Admitting: Cardiovascular Disease

## 2016-06-29 DIAGNOSIS — R601 Generalized edema: Secondary | ICD-10-CM

## 2016-06-29 DIAGNOSIS — R600 Localized edema: Secondary | ICD-10-CM | POA: Diagnosis not present

## 2016-06-29 DIAGNOSIS — M25562 Pain in left knee: Secondary | ICD-10-CM | POA: Diagnosis not present

## 2016-06-29 DIAGNOSIS — M25462 Effusion, left knee: Secondary | ICD-10-CM | POA: Diagnosis not present

## 2016-06-29 DIAGNOSIS — I2510 Atherosclerotic heart disease of native coronary artery without angina pectoris: Secondary | ICD-10-CM | POA: Diagnosis not present

## 2016-06-29 NOTE — Telephone Encounter (Signed)
Received records from [facility] for appointment on 09/02/16 with Dr Sallyanne Kuster.,  Records given to [facility] (medical records) for Dr Croitoru's schedule on 09/02/16. lp

## 2016-07-18 DIAGNOSIS — R1011 Right upper quadrant pain: Secondary | ICD-10-CM | POA: Diagnosis not present

## 2016-07-18 DIAGNOSIS — M62838 Other muscle spasm: Secondary | ICD-10-CM | POA: Diagnosis not present

## 2016-07-20 ENCOUNTER — Other Ambulatory Visit: Payer: Self-pay | Admitting: Internal Medicine

## 2016-07-20 DIAGNOSIS — R1011 Right upper quadrant pain: Secondary | ICD-10-CM

## 2016-07-29 ENCOUNTER — Ambulatory Visit
Admission: RE | Admit: 2016-07-29 | Discharge: 2016-07-29 | Disposition: A | Discharge status: Home / Self Care | Payer: Medicare Other | Source: Ambulatory Visit | Attending: Internal Medicine | Admitting: Internal Medicine

## 2016-07-29 DIAGNOSIS — R1011 Right upper quadrant pain: Secondary | ICD-10-CM

## 2016-07-29 DIAGNOSIS — K7689 Other specified diseases of liver: Secondary | ICD-10-CM | POA: Diagnosis not present

## 2016-08-24 DIAGNOSIS — J449 Chronic obstructive pulmonary disease, unspecified: Secondary | ICD-10-CM | POA: Insufficient documentation

## 2016-08-24 DIAGNOSIS — A770 Spotted fever due to Rickettsia rickettsii: Secondary | ICD-10-CM | POA: Insufficient documentation

## 2016-08-24 DIAGNOSIS — K635 Polyp of colon: Secondary | ICD-10-CM | POA: Insufficient documentation

## 2016-08-24 DIAGNOSIS — E7800 Pure hypercholesterolemia, unspecified: Secondary | ICD-10-CM | POA: Insufficient documentation

## 2016-08-24 DIAGNOSIS — D119 Benign neoplasm of major salivary gland, unspecified: Secondary | ICD-10-CM

## 2016-08-24 DIAGNOSIS — I10 Essential (primary) hypertension: Secondary | ICD-10-CM

## 2016-08-24 DIAGNOSIS — D369 Benign neoplasm, unspecified site: Secondary | ICD-10-CM

## 2016-08-24 DIAGNOSIS — M109 Gout, unspecified: Secondary | ICD-10-CM | POA: Insufficient documentation

## 2016-09-02 ENCOUNTER — Encounter: Payer: Self-pay | Admitting: Cardiovascular Disease

## 2016-09-02 ENCOUNTER — Ambulatory Visit (INDEPENDENT_AMBULATORY_CARE_PROVIDER_SITE_OTHER): Payer: Medicare Other | Admitting: Cardiovascular Disease

## 2016-09-02 VITALS — BP 140/70 | HR 57 | Ht 69.0 in | Wt 191.0 lb

## 2016-09-02 DIAGNOSIS — E7800 Pure hypercholesterolemia, unspecified: Secondary | ICD-10-CM

## 2016-09-02 DIAGNOSIS — I739 Peripheral vascular disease, unspecified: Secondary | ICD-10-CM | POA: Diagnosis not present

## 2016-09-02 DIAGNOSIS — I2510 Atherosclerotic heart disease of native coronary artery without angina pectoris: Secondary | ICD-10-CM | POA: Diagnosis not present

## 2016-09-02 DIAGNOSIS — I10 Essential (primary) hypertension: Secondary | ICD-10-CM

## 2016-09-02 NOTE — Progress Notes (Signed)
Cardiology Office Note    Date:  09/02/2016   ID:  Jordan Chambers, DOB [birth year], MRN [id number]  PCP:  Jordan Pel, MD  Cardiologist:   Jordan Klein, MD   Chief Complaint  Patient presents with  . New Evaluation    former patient, to re esablish care     History of Present Illness:  Jordan Chambers is a 76 y.o. male with a long-standing history of coronary artery disease here to reestablish care.   A treadmill stress test in 2000 showed the presence of coronary artery disease and he underwent placement of 3 stents by Dr. Glade Chambers. 6 months later had to have angioplasty for restenosis. He has not had any coronary events since that time. He has not had cardiology evaluation in many years. In 2011 he had a normal nuclear stress test.  Despite an active lifestyle, he denies shortness of breath or angina. He is primarily limited by pain in his right hip and may have to undergo a hip replacement in the future. He denies syncope, palpitations, claudication, focal neurological deficits. He has never had chest pain. He's had intermittent swelling and some discomfort in his left knee and calf. Venous Dopplers did not show DVT. The swelling always resolves after lying in bed overnight  He is followed by Jordan Chambers and appears to have excellent control of his risk factors, other than the fact that he is mildly overweight. He quit smoking 5 years ago. 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 His blood pressure is well controlled. He is on a statin and his most recent LDL cholesterol was 50. He does not have full-blown diabetes mellitus but his hemoglobin A1c was borderline at 6.2%, managed with diet only.  In 2009 lower extremity arterial Dopplers showed a right ABI of 0.57 (obstruction at the level of the thigh with monophasic popliteal and tibial waveforms) with a normal left ABI of 0.95    Past Medical History:  Diagnosis Date  . CAD (coronary artery  disease)    angioplasty 2000; 3 stents  . Edema   . Hyperlipidemia   . Hypertension     Past Surgical History:  Procedure Laterality Date  . ANGIOPLASTY  2000   3 stents  . APPENDECTOMY  1971  . TONSILLECTOMY  1951  . VASECTOMY  1972    Current Medications: Outpatient Medications Prior to Visit  Medication Sig Dispense Refill  . amLODipine (NORVASC) 10 MG tablet Take 10 mg by mouth daily.  0  . irbesartan (AVAPRO) 150 MG tablet Take 150 mg by mouth daily.  0  . metoprolol succinate (TOPROL-XL) 100 MG 24 hr tablet Take 0.5 tablets by mouth 2 (two) times daily.  0  . rosuvastatin (CRESTOR) 10 MG tablet Take 10 mg by mouth daily.  0  . aspirin EC 81 MG tablet Take 81 mg by mouth daily.     No facility-administered medications prior to visit.      Allergies:   Patient has no known allergies.   Social History   Social History  . Marital status: Married    Spouse name: N/A  . Number of children: N/A  . Years of education: N/A   Social History Main Topics  . Smoking status: Never Smoker  . Smokeless tobacco: Never Used  . Alcohol use No  . Drug use: No  . Sexual activity: Not Asked   Other Topics Concern  . None   Social History Narrative  .  None     Family History:  The patient's He was adopted. Family history is unknown by patient.   ROS:   Please see the history of present illness.    ROS All other systems reviewed and are negative.   PHYSICAL EXAM:   VS:  BP 140/70 (BP Location: Left Arm, Patient Position: Sitting, Cuff Size: Normal)   Pulse (!) 57   Ht 5\' 9"  (1.753 m)   Wt 191 lb (86.6 kg)   BMI 28.21 kg/m    Recheck BP 128/72 GEN: Well nourished, well developed, in no acute distress  HEENT: normal  Neck: no JVD, carotid bruits, or masses Cardiac: RRR; no murmurs, rubs, or gallops,no edema . Normal radial, ulnar femoral and left pedal pulses. Diminished right pedal pulses. Respiratory:  clear to auscultation bilaterally, normal work of  breathing GI: soft, nontender, nondistended, + BS MS: no deformity or atrophy  Skin: warm and dry, no rash Neuro:  Alert and Oriented x 3, Strength and sensation are intact Psych: euthymic mood, full affect  Wt Readings from Last 3 Encounters:  09/02/16 191 lb (86.6 kg)  04/02/14 196 lb (88.9 kg)  03/19/14 196 lb 3.2 oz (89 kg)      Studies/Labs Reviewed:   EKG:  EKG is ordered today.  The ekg ordered today demonstrates Mild sinus bradycardia with nonspecific ST segment changes  Recent Labs: Creatinine 0.9   Lipid Panel Total cholesterol 119, triglycerides 117, HDL 46, LDL 50, A1c 6.2%  Additional studies/ records that were reviewed today include:  Notes from Jordan Chambers    ASSESSMENT:    1. Coronary artery disease involving native coronary artery of native heart without angina pectoris   2. PAD (peripheral artery disease) ([place])   3. Hypercholesteremia   4. Essential hypertension      PLAN:  In order of problems listed above:  1. CAD: Asymptomatic. Risk factors appear to be well addressed. He should restart taking aspirin 81 mg daily which he stopped about a year ago. Per his report he never had angina pectoris when he first developed CAD. It is reasonable to perform a simple treadmill stress test. This may be limited by his hip problems, but will give it a try. 2. PAD: There is convincing evidence of right superficial femoral artery occlusion by physical exam and this was documented on lower extremity arterial Dopplers as far back as 2009. As long as he does not have symptoms of intermittent claudication, revascularization is not indicated (again, claudication symptoms may be masked by physical activity limitations from hip problems). Treatment for coronary risk factors should provide appropriate treatment for the PAD as well. 3. HLP: Most recent lipid profile within target parameters 4. HTN: Blood pressure control is satisfactory    Medication Adjustments/Labs and  Tests Ordered: Current medicines are reviewed at length with the patient today.  Concerns regarding medicines are outlined above.  Medication changes, Labs and Tests ordered today are listed in the Patient Instructions below. Patient Instructions  Your physician has requested that you have an exercise tolerance test. For further information please visit [email or web address]. Please also follow instruction sheet, as given.  Dr Sallyanne Kuster recommends that you schedule a follow-up appointment in 1 year. You will receive a reminder letter in the mail two months in advance. If you don't receive a letter, please call our office to schedule the follow-up appointment.  If you need a refill on your cardiac medications before your next appointment, please call your pharmacy.  Signed, Jordan Klein, MD  09/02/2016 1:30 PM    [facility] [place], [place], [place]  [address] Phone: [phone]; Fax: [phone]

## 2016-09-02 NOTE — Patient Instructions (Addendum)
Your physician has requested that you have an exercise tolerance test. For further information please visit [email or web address]. Please also follow instruction sheet, as given.  Dr Croitoru recommends that you schedule a follow-up appointment in 1 year. You will receive a reminder letter in the mail two months in advance. If you don't receive a letter, please call our office to schedule the follow-up appointment.  If you need a refill on your cardiac medications before your next appointment, please call your pharmacy. 

## 2016-09-06 ENCOUNTER — Telehealth (HOSPITAL_COMMUNITY): Payer: Self-pay

## 2016-09-06 NOTE — Telephone Encounter (Signed)
Encounter complete. 

## 2016-09-07 ENCOUNTER — Telehealth: Payer: Self-pay | Admitting: Cardiovascular Disease

## 2016-09-07 ENCOUNTER — Telehealth (HOSPITAL_COMMUNITY): Payer: Self-pay

## 2016-09-07 NOTE — Telephone Encounter (Signed)
Jordan Chambers is returning your call. Thanks.

## 2016-09-07 NOTE — Telephone Encounter (Signed)
Left message to call back and ask for triage, not sure who called patient

## 2016-09-07 NOTE — Telephone Encounter (Signed)
Encounter complete. 

## 2016-09-08 ENCOUNTER — Ambulatory Visit (HOSPITAL_COMMUNITY)
Admission: RE | Admit: 2016-09-08 | Discharge: 2016-09-08 | Disposition: A | Discharge status: Home / Self Care | Payer: Medicare Other | Source: Ambulatory Visit | Attending: Cardiology | Admitting: Cardiology

## 2016-09-08 DIAGNOSIS — I2510 Atherosclerotic heart disease of native coronary artery without angina pectoris: Secondary | ICD-10-CM | POA: Diagnosis not present

## 2016-09-08 DIAGNOSIS — R9439 Abnormal result of other cardiovascular function study: Secondary | ICD-10-CM | POA: Diagnosis not present

## 2016-09-08 LAB — EXERCISE TOLERANCE TEST
CHL CUP MPHR: 143 {beats}/min
CHL CUP RESTING HR STRESS: 70 {beats}/min
CHL CUP STRESS STAGE 1 SBP: 167 mmHg
CHL CUP STRESS STAGE 3 GRADE: 10 %
CHL CUP STRESS STAGE 3 SPEED: 1.7 mph
CHL CUP STRESS STAGE 4 GRADE: 12 %
CHL CUP STRESS STAGE 4 HR: 122 {beats}/min
CHL CUP STRESS STAGE 4 SPEED: 2.5 mph
CHL CUP STRESS STAGE 5 DBP: 88 mmHg
CHL CUP STRESS STAGE 5 SBP: 216 mmHg
CHL CUP STRESS STAGE 6 HR: 76 {beats}/min
CHL RATE OF PERCEIVED EXERTION: 18
CSEPEDS: 21 s
CSEPPHR: 122 {beats}/min
CSEPPMHR: 85 %
Estimated workload: 6.2 METS
Exercise duration (min): 4 min
Percent HR: 85 %
Stage 1 DBP: 81 mmHg
Stage 1 Grade: 0 %
Stage 1 HR: 80 {beats}/min
Stage 1 Speed: 0 mph
Stage 2 Grade: 0 %
Stage 2 HR: 80 {beats}/min
Stage 2 Speed: 0 mph
Stage 3 DBP: 77 mmHg
Stage 3 HR: 105 {beats}/min
Stage 3 SBP: 214 mmHg
Stage 5 Grade: 0 %
Stage 5 HR: 105 {beats}/min
Stage 5 Speed: 0 mph
Stage 6 DBP: 70 mmHg
Stage 6 Grade: 0 %
Stage 6 SBP: 172 mmHg
Stage 6 Speed: 0 mph

## 2016-09-12 DIAGNOSIS — Z125 Encounter for screening for malignant neoplasm of prostate: Secondary | ICD-10-CM | POA: Diagnosis not present

## 2016-09-12 DIAGNOSIS — E7800 Pure hypercholesterolemia, unspecified: Secondary | ICD-10-CM | POA: Diagnosis not present

## 2016-09-12 DIAGNOSIS — I10 Essential (primary) hypertension: Secondary | ICD-10-CM | POA: Diagnosis not present

## 2016-09-15 DIAGNOSIS — I2510 Atherosclerotic heart disease of native coronary artery without angina pectoris: Secondary | ICD-10-CM | POA: Diagnosis not present

## 2016-09-15 DIAGNOSIS — E7800 Pure hypercholesterolemia, unspecified: Secondary | ICD-10-CM | POA: Diagnosis not present

## 2016-09-15 DIAGNOSIS — Z9889 Other specified postprocedural states: Secondary | ICD-10-CM | POA: Diagnosis not present

## 2016-09-15 DIAGNOSIS — J449 Chronic obstructive pulmonary disease, unspecified: Secondary | ICD-10-CM | POA: Diagnosis not present

## 2016-09-15 DIAGNOSIS — L570 Actinic keratosis: Secondary | ICD-10-CM | POA: Diagnosis not present

## 2016-09-16 ENCOUNTER — Telehealth: Payer: Self-pay | Admitting: Cardiovascular Disease

## 2016-09-16 NOTE — Telephone Encounter (Signed)
New Message      Returning [facility] call from earlier

## 2016-09-16 NOTE — Telephone Encounter (Signed)
Called patient with results. Patient verbalized understanding. 

## 2017-01-31 DIAGNOSIS — J309 Allergic rhinitis, unspecified: Secondary | ICD-10-CM | POA: Diagnosis not present

## 2017-01-31 DIAGNOSIS — J329 Chronic sinusitis, unspecified: Secondary | ICD-10-CM | POA: Diagnosis not present

## 2017-03-27 DIAGNOSIS — R319 Hematuria, unspecified: Secondary | ICD-10-CM | POA: Diagnosis not present

## 2017-03-27 DIAGNOSIS — R300 Dysuria: Secondary | ICD-10-CM | POA: Diagnosis not present

## 2017-03-27 DIAGNOSIS — N390 Urinary tract infection, site not specified: Secondary | ICD-10-CM | POA: Diagnosis not present

## 2017-04-03 DIAGNOSIS — R310 Gross hematuria: Secondary | ICD-10-CM | POA: Diagnosis not present

## 2017-04-06 DIAGNOSIS — H5213 Myopia, bilateral: Secondary | ICD-10-CM | POA: Diagnosis not present

## 2017-05-18 DIAGNOSIS — N200 Calculus of kidney: Secondary | ICD-10-CM | POA: Diagnosis not present

## 2017-05-18 DIAGNOSIS — R310 Gross hematuria: Secondary | ICD-10-CM | POA: Diagnosis not present

## 2017-07-02 DIAGNOSIS — J018 Other acute sinusitis: Secondary | ICD-10-CM | POA: Diagnosis not present

## 2017-07-02 DIAGNOSIS — H6123 Impacted cerumen, bilateral: Secondary | ICD-10-CM | POA: Diagnosis not present

## 2017-07-19 DIAGNOSIS — R310 Gross hematuria: Secondary | ICD-10-CM | POA: Diagnosis not present

## 2017-07-19 DIAGNOSIS — N390 Urinary tract infection, site not specified: Secondary | ICD-10-CM | POA: Diagnosis not present

## 2017-08-17 DIAGNOSIS — R310 Gross hematuria: Secondary | ICD-10-CM | POA: Diagnosis not present

## 2017-08-17 DIAGNOSIS — N138 Other obstructive and reflux uropathy: Secondary | ICD-10-CM | POA: Diagnosis not present

## 2017-08-28 DIAGNOSIS — N35812 Other urethral bulbous stricture, male: Secondary | ICD-10-CM | POA: Diagnosis not present

## 2017-08-28 DIAGNOSIS — R310 Gross hematuria: Secondary | ICD-10-CM | POA: Diagnosis not present

## 2017-08-28 DIAGNOSIS — C670 Malignant neoplasm of trigone of bladder: Secondary | ICD-10-CM | POA: Diagnosis not present

## 2017-08-28 DIAGNOSIS — N200 Calculus of kidney: Secondary | ICD-10-CM | POA: Diagnosis not present

## 2017-09-01 ENCOUNTER — Encounter (HOSPITAL_BASED_OUTPATIENT_CLINIC_OR_DEPARTMENT_OTHER): Payer: Self-pay | Admitting: *Deleted

## 2017-09-01 ENCOUNTER — Other Ambulatory Visit: Payer: Self-pay | Admitting: Urology

## 2017-09-05 ENCOUNTER — Other Ambulatory Visit: Payer: Self-pay

## 2017-09-05 ENCOUNTER — Encounter (HOSPITAL_BASED_OUTPATIENT_CLINIC_OR_DEPARTMENT_OTHER): Payer: Self-pay | Admitting: *Deleted

## 2017-09-05 DIAGNOSIS — Z01818 Encounter for other preprocedural examination: Secondary | ICD-10-CM | POA: Diagnosis not present

## 2017-09-05 DIAGNOSIS — J309 Allergic rhinitis, unspecified: Secondary | ICD-10-CM | POA: Diagnosis not present

## 2017-09-05 DIAGNOSIS — R05 Cough: Secondary | ICD-10-CM | POA: Diagnosis not present

## 2017-09-05 NOTE — Progress Notes (Addendum)
SPOKE W/ PT VIA PHONE FOR PRE-OP INTERVIEW.  NPO AFTER MN.  ARRIVE AT 0900.  NEEDS ISTAT AND EKG.  WILL TAKE AM MEDS W/ SIPS OF WATER.  ADVISED TO SEE HIS PCP PRIOR TO DOS SINCE PT  STATES HE HAS CONGESTION IN THROAT, COUGH, AND OCCASIONAL PRODUCTIVE WITH YELLOW COLOR.  PT DENIES FEVER.  ADDENDUM:  PT CALLED VIA PHONE TODAY (time 1349), STATED HAS WAS SEEN BY HIS PCP TODAY.  PT STATED TOLD BY PCP WAS JUST IS SINUSES AND WOULD BE FINE FOR SURGERY.

## 2017-09-06 NOTE — H&P (Signed)
CC: I have blood in my urine.  HPI: Jordan Chambers is a 77 year-old male established patient who is here for blood in the urine.  He did see the blood in his urine. He has not seen blood clots.   He does not have a burning sensation when he urinates. He is not currently having trouble urinating.   He is not having pain. He has not recently had unwanted weight loss.   His last U/S or CT Scan was 07/29/2016.   Jordan Chambers returns today in f/u for his history of hematuria. He was seen by Fritz Pickerel on 12/20 with another episode of bleeding but that has cleared and his UA is unremarkable today.   He had a renal US in 9/18 that only showed some small left renal stones.     ALLERGIES: No Allergies    MEDICATIONS: Metoprolol Tartrate  Tamsulosin Hcl 0.4 mg capsule 1 capsule PO Q PM  Amlodipine Besylate 10 mg tablet Oral  Irbesartan  Rosuvastatin Calcium     GU PSH: Cystoscopy - 2008 Vasectomy - 2008      PSH Notes: Cath Stent Placement, Cystoscopy (Diagnostic), Surgery Of Male Genitalia Vasectomy, Appendectomy   NON-GU PSH: Appendectomy - 2008    GU PMH: Gross hematuria - 08/17/2017, (Improving), He has had no further hematuria and is not interested in cystoscopy at this time. he does have some left renal stones on [place] that measure 2-17mm. I will just have him return in 3 months and if he has had anymore hematuria, I will push for the cystoscopy. , - 05/18/2017, Gross hematuria, - 2016 Urinary Obstruction - 08/17/2017 Renal calculus - 05/18/2017 Elevated PSA, Elevated prostate specific antigen (PSA) - 2016 Personal Hx Urinary Tract Infections, History of urinary tract infection - 2016 Urethral Stricture, Unspec, Urethral stricture - 2016 Other microscopic hematuria, Microscopic Hematuria - 2014      PMH Notes:  2006-07-18 13:18:15 - Note: Coronary Artery Disease  2010-12-22 14:56:54 - Note: FH Unobtainable - Patient Adopted   NON-GU PMH: Encounter for general adult medical examination  without abnormal findings, Encounter for preventive health examination - 2016 Skin Cancer, History, History of malignant neoplasm of skin - 2016 Gout, Gout - 2014 Personal history of other diseases of the circulatory system, History of hypertension - 2014 Personal history of other endocrine, nutritional and metabolic disease, History of hypercholesterolemia - 2014 Personal history of other specified conditions, History of lymphadenopathy - 2014    FAMILY HISTORY: adopted - Runs In Family Aneurysm - Runs In Family Family Health Status - Father alive at age 97 - 30 In Family Family Health Status - Mother's Age - Runs In Family Family Health Status Number - Runs In Family malignant neoplasm - Runs In Family   SOCIAL HISTORY: Marital Status: Married Preferred Language: English; Ethnicity: Not Hispanic Or Latino; Race: White     Notes: Former smoker, Caffeine Use, Tobacco Use, Marital History - Currently Married, Occupation:, Alcohol Use   REVIEW OF SYSTEMS:    GU Review Male:   Patient reports frequent urination. Patient denies hard to postpone urination, burning/ pain with urination, get up at night to urinate, leakage of urine, stream starts and stops, trouble starting your stream, have to strain to urinate , erection problems, and penile pain.  Gastrointestinal (Upper):   Patient denies nausea, vomiting, and indigestion/ heartburn.  Gastrointestinal (Lower):   Patient denies diarrhea and constipation.  Constitutional:   Patient denies fever, night sweats, weight loss, and fatigue.  Skin:  Patient denies skin rash/ lesion and itching.  Eyes:   Patient denies double vision and blurred vision.  Ears/ Nose/ Throat:   Patient denies sore throat and sinus problems.  Hematologic/Lymphatic:   Patient denies swollen glands and easy bruising.  Cardiovascular:   Patient denies leg swelling and chest pains.  Respiratory:   Patient denies cough and shortness of breath.  Endocrine:   Patient  denies excessive thirst.  Musculoskeletal:   Patient denies back pain and joint pain.  Neurological:   Patient denies headaches and dizziness.  Psychologic:   Patient denies depression and anxiety.   VITAL SIGNS:      08/28/2017 01:19 PM  BP 152/76 mmHg  Pulse 66 /min  Temperature 98.2 F / 36.7 C   MULTI-SYSTEM PHYSICAL EXAMINATION:    Constitutional: Well-nourished. No physical deformities. Normally developed. Good grooming.   Respiratory: No labored breathing, no use of accessory muscles. CTA  Cardiovascular: Normal temperature, normal extremity pulses, no swelling, no varicosities. RRR without murmur     PAST DATA REVIEWED:  Source Of History:  Patient  Urine Test Review:   Urinalysis   09/09/14  PSA  Total PSA 2.23     PROCEDURES:         Flexible Cystoscopy - 52000  Risks, benefits, and some of the potential complications of the procedure were discussed. Cipro 500mg  given for antibiotic prophylaxis.     Meatus:  Normal size. Normal location. Normal condition.  Urethra:  Mild bulbous stricture.  External Sphincter:  Normal.  Verumontanum:  Normal.  Prostate:  Obstructing. Enlarged median lobe. Moderate hyperplasia.  Bladder Neck:  Non-obstructing.  Ureteral Orifices:  Left Normal location. Normal size. Normal shape. Effluxed clear urine. Right obscured by papillary tumor.   Bladder:  Moderate trabeculation. A few trigone tumors with a 1.5cm right trigone tumor over the orifice and a 46mm tumor lateral to the left UO.Marland Kitchen Normal mucosa. No stones.      The procedure was well tolerated and there were no complications.         Urinalysis w/Scope Dipstick Dipstick Cont'd Micro  Color: Yellow Bilirubin: Neg WBC/hpf: NS (Not Seen)  Appearance: Clear Ketones: Neg RBC/hpf: 0 - 2/hpf  Specific Gravity: 1.030 Blood: 2+ Bacteria: Rare (0-9/hpf)  pH: 5.5 Protein: Trace Cystals: NS (Not Seen)  Glucose: Neg Urobilinogen: 1.0 Casts: NS (Not Seen)    Nitrites: Neg Trichomonas: Not  Present    Leukocyte Esterase: Neg Mucous: Present      Epithelial Cells: 0 - 5/hpf      Yeast: NS (Not Seen)      Sperm: Not Present    Notes: Microscopic done on unconcentrated urine    ASSESSMENT:      ICD-10 Details  1 GU:   Gross hematuria - R31.0   2   Renal calculus - N20.0   3   Urethral Stricture, 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 The largest is in the area of the right UO and is about 1.5cm. I am going to set him up for cystoscopy with bil retrogrades, TURBT, possible epirubicin instillation and possible stent. I have reviewed the risks of bleeding ,infection, ureteral and bladder injury, ureteral stricture, urethral stricture, need for secondary procedures, thrombotic events and anesthetic complications.    PLAN:           Schedule Return Visit/Planned Activity: Next Available Appointment - Schedule  Surgery

## 2017-09-07 ENCOUNTER — Other Ambulatory Visit: Payer: Self-pay

## 2017-09-07 ENCOUNTER — Ambulatory Visit (HOSPITAL_BASED_OUTPATIENT_CLINIC_OR_DEPARTMENT_OTHER): Payer: Medicare Other | Admitting: Anesthesiology

## 2017-09-07 ENCOUNTER — Ambulatory Visit (HOSPITAL_BASED_OUTPATIENT_CLINIC_OR_DEPARTMENT_OTHER)
Admission: RE | Admit: 2017-09-07 | Discharge: 2017-09-07 | Disposition: A | Discharge status: Home / Self Care | Payer: Medicare Other | Source: Ambulatory Visit | Attending: Urology | Admitting: Urology

## 2017-09-07 ENCOUNTER — Encounter (HOSPITAL_BASED_OUTPATIENT_CLINIC_OR_DEPARTMENT_OTHER): Payer: Self-pay | Admitting: *Deleted

## 2017-09-07 ENCOUNTER — Encounter (HOSPITAL_BASED_OUTPATIENT_CLINIC_OR_DEPARTMENT_OTHER)
Admission: RE | Disposition: A | Discharge status: Home / Self Care | Payer: Self-pay | Source: Ambulatory Visit | Attending: Urology

## 2017-09-07 DIAGNOSIS — N3289 Other specified disorders of bladder: Secondary | ICD-10-CM | POA: Insufficient documentation

## 2017-09-07 DIAGNOSIS — C670 Malignant neoplasm of trigone of bladder: Secondary | ICD-10-CM | POA: Diagnosis not present

## 2017-09-07 DIAGNOSIS — Z7982 Long term (current) use of aspirin: Secondary | ICD-10-CM | POA: Insufficient documentation

## 2017-09-07 DIAGNOSIS — Z87891 Personal history of nicotine dependence: Secondary | ICD-10-CM | POA: Insufficient documentation

## 2017-09-07 DIAGNOSIS — N3020 Other chronic cystitis without hematuria: Secondary | ICD-10-CM | POA: Diagnosis not present

## 2017-09-07 DIAGNOSIS — I10 Essential (primary) hypertension: Secondary | ICD-10-CM | POA: Insufficient documentation

## 2017-09-07 DIAGNOSIS — Z79899 Other long term (current) drug therapy: Secondary | ICD-10-CM | POA: Insufficient documentation

## 2017-09-07 DIAGNOSIS — Z85828 Personal history of other malignant neoplasm of skin: Secondary | ICD-10-CM | POA: Diagnosis not present

## 2017-09-07 DIAGNOSIS — J449 Chronic obstructive pulmonary disease, unspecified: Secondary | ICD-10-CM | POA: Diagnosis not present

## 2017-09-07 DIAGNOSIS — Z955 Presence of coronary angioplasty implant and graft: Secondary | ICD-10-CM | POA: Diagnosis not present

## 2017-09-07 DIAGNOSIS — E7800 Pure hypercholesterolemia, unspecified: Secondary | ICD-10-CM | POA: Insufficient documentation

## 2017-09-07 DIAGNOSIS — C672 Malignant neoplasm of lateral wall of bladder: Secondary | ICD-10-CM | POA: Insufficient documentation

## 2017-09-07 DIAGNOSIS — N401 Enlarged prostate with lower urinary tract symptoms: Secondary | ICD-10-CM | POA: Diagnosis not present

## 2017-09-07 DIAGNOSIS — N138 Other obstructive and reflux uropathy: Secondary | ICD-10-CM | POA: Diagnosis not present

## 2017-09-07 DIAGNOSIS — N35919 Unspecified urethral stricture, male, unspecified site: Secondary | ICD-10-CM | POA: Diagnosis not present

## 2017-09-07 DIAGNOSIS — N3030 Trigonitis without hematuria: Secondary | ICD-10-CM | POA: Diagnosis not present

## 2017-09-07 DIAGNOSIS — E785 Hyperlipidemia, unspecified: Secondary | ICD-10-CM | POA: Insufficient documentation

## 2017-09-07 DIAGNOSIS — I2510 Atherosclerotic heart disease of native coronary artery without angina pectoris: Secondary | ICD-10-CM | POA: Insufficient documentation

## 2017-09-07 DIAGNOSIS — I739 Peripheral vascular disease, unspecified: Secondary | ICD-10-CM | POA: Diagnosis not present

## 2017-09-07 DIAGNOSIS — D494 Neoplasm of unspecified behavior of bladder: Secondary | ICD-10-CM | POA: Diagnosis not present

## 2017-09-07 HISTORY — DX: Gross hematuria: R31.0

## 2017-09-07 HISTORY — DX: Presence of dental prosthetic device (complete) (partial): Z97.2

## 2017-09-07 HISTORY — DX: Chronic obstructive pulmonary disease, unspecified: J44.9

## 2017-09-07 HISTORY — PX: CYSTOSCOPY W/ RETROGRADES: SHX1426

## 2017-09-07 HISTORY — DX: Disorder of arteries and arterioles, unspecified: I77.9

## 2017-09-07 HISTORY — DX: Diverticulosis of large intestine without perforation or abscess without bleeding: K57.30

## 2017-09-07 HISTORY — DX: Complete loss of teeth, unspecified cause, unspecified class: K08.109

## 2017-09-07 HISTORY — DX: Unspecified urethral stricture, male, meatal: N35.911

## 2017-09-07 HISTORY — DX: Presence of coronary angioplasty implant and graft: Z95.5

## 2017-09-07 HISTORY — DX: Other general symptoms and signs: R68.89

## 2017-09-07 HISTORY — DX: Solitary pulmonary nodule: R91.1

## 2017-09-07 HISTORY — DX: Personal history of other malignant neoplasm of skin: Z85.828

## 2017-09-07 HISTORY — DX: Personal history of other medical treatment: Z92.89

## 2017-09-07 HISTORY — DX: Neoplasm of unspecified behavior of bladder: D49.4

## 2017-09-07 HISTORY — DX: Presence of spectacles and contact lenses: Z97.3

## 2017-09-07 HISTORY — PX: CYSTOSCOPY WITH URETHRAL DILATATION: SHX5125

## 2017-09-07 HISTORY — DX: Personal history of colonic polyps: Z86.010

## 2017-09-07 HISTORY — DX: Personal history of adenomatous and serrated colon polyps: Z86.0101

## 2017-09-07 HISTORY — DX: Personal history of other malignant neoplasm of skin: Z98.890

## 2017-09-07 HISTORY — PX: TRANSURETHRAL RESECTION OF BLADDER TUMOR: SHX2575

## 2017-09-07 LAB — POCT I-STAT, CHEM 8
BUN: 13 mg/dL (ref 6–20)
CREATININE: 0.7 mg/dL (ref 0.61–1.24)
Calcium, Ion: 1.19 mmol/L (ref 1.15–1.40)
Chloride: 97 mmol/L — ABNORMAL LOW (ref 101–111)
Glucose, Bld: 108 mg/dL — ABNORMAL HIGH (ref 65–99)
HEMATOCRIT: 41 % (ref 39.0–52.0)
Hemoglobin: 13.9 g/dL (ref 13.0–17.0)
POTASSIUM: 3.6 mmol/L (ref 3.5–5.1)
Sodium: 140 mmol/L (ref 135–145)
TCO2: 31 mmol/L (ref 22–32)

## 2017-09-07 SURGERY — TURBT (TRANSURETHRAL RESECTION OF BLADDER TUMOR)
Anesthesia: General | Site: Urethra

## 2017-09-07 MED ORDER — CEFAZOLIN SODIUM-DEXTROSE 2-4 GM/100ML-% IV SOLN
2.0000 g | INTRAVENOUS | Status: AC
  Administered 2017-09-07: 2 g via INTRAVENOUS
  Filled 2017-09-07: qty 100

## 2017-09-07 MED ORDER — LIDOCAINE 2% (20 MG/ML) 5 ML SYRINGE
INTRAMUSCULAR | Status: DC | PRN
  Administered 2017-09-07: 60 mg via INTRAVENOUS

## 2017-09-07 MED ORDER — ONDANSETRON HCL 4 MG/2ML IJ SOLN
INTRAMUSCULAR | Status: AC
  Filled 2017-09-07: qty 2

## 2017-09-07 MED ORDER — CEFAZOLIN SODIUM-DEXTROSE 2-4 GM/100ML-% IV SOLN
INTRAVENOUS | Status: AC
  Filled 2017-09-07: qty 100

## 2017-09-07 MED ORDER — DEXAMETHASONE SODIUM PHOSPHATE 10 MG/ML IJ SOLN
INTRAMUSCULAR | Status: AC
  Filled 2017-09-07: qty 1

## 2017-09-07 MED ORDER — PROPOFOL 10 MG/ML IV BOLUS
INTRAVENOUS | Status: AC
  Filled 2017-09-07: qty 20

## 2017-09-07 MED ORDER — SODIUM CHLORIDE 0.9% FLUSH
3.0000 mL | INTRAVENOUS | Status: DC | PRN
  Filled 2017-09-07: qty 3

## 2017-09-07 MED ORDER — TRAMADOL HCL 50 MG PO TABS: 50.0000 mg | ORAL_TABLET | Freq: Four times a day (QID) | ORAL | 0 refills | Status: AC | PRN

## 2017-09-07 MED ORDER — SODIUM CHLORIDE 0.9 % IV SOLN
80.0000 mg | Freq: Once | INTRAVENOUS | Status: AC
  Administered 2017-09-07: 80 mg via INTRAVESICAL
  Filled 2017-09-07 (×2): qty 40

## 2017-09-07 MED ORDER — ONDANSETRON HCL 4 MG/2ML IJ SOLN
INTRAMUSCULAR | Status: DC | PRN
  Administered 2017-09-07: 4 mg via INTRAVENOUS

## 2017-09-07 MED ORDER — ACETAMINOPHEN 650 MG RE SUPP
650.0000 mg | RECTAL | Status: DC | PRN
  Filled 2017-09-07: qty 1

## 2017-09-07 MED ORDER — SODIUM CHLORIDE 0.9 % IR SOLN
Status: DC | PRN
  Administered 2017-09-07: 3000 mL via INTRAVESICAL

## 2017-09-07 MED ORDER — OXYCODONE HCL 5 MG PO TABS
5.0000 mg | ORAL_TABLET | ORAL | Status: DC | PRN
  Filled 2017-09-07: qty 2

## 2017-09-07 MED ORDER — ONDANSETRON HCL 4 MG/2ML IJ SOLN
4.0000 mg | Freq: Once | INTRAMUSCULAR | Status: DC | PRN
  Filled 2017-09-07: qty 2

## 2017-09-07 MED ORDER — EPHEDRINE 5 MG/ML INJ
INTRAVENOUS | Status: AC
  Filled 2017-09-07: qty 10

## 2017-09-07 MED ORDER — PROPOFOL 10 MG/ML IV BOLUS
INTRAVENOUS | Status: DC | PRN
  Administered 2017-09-07: 80 mg via INTRAVENOUS
  Administered 2017-09-07: 120 mg via INTRAVENOUS

## 2017-09-07 MED ORDER — ACETAMINOPHEN 325 MG PO TABS
650.0000 mg | ORAL_TABLET | ORAL | Status: DC | PRN
  Filled 2017-09-07: qty 2

## 2017-09-07 MED ORDER — LACTATED RINGERS IV SOLN
INTRAVENOUS | Status: DC
  Administered 2017-09-07: 10:00:00 via INTRAVENOUS
  Administered 2017-09-07: 1000 mL via INTRAVENOUS
  Filled 2017-09-07: qty 1000

## 2017-09-07 MED ORDER — DEXAMETHASONE SODIUM PHOSPHATE 10 MG/ML IJ SOLN
INTRAMUSCULAR | Status: DC | PRN
  Administered 2017-09-07: 10 mg via INTRAVENOUS

## 2017-09-07 MED ORDER — FENTANYL CITRATE (PF) 100 MCG/2ML IJ SOLN
INTRAMUSCULAR | Status: AC
  Filled 2017-09-07: qty 2

## 2017-09-07 MED ORDER — MORPHINE SULFATE (PF) 2 MG/ML IV SOLN
2.0000 mg | INTRAVENOUS | Status: DC | PRN
  Filled 2017-09-07: qty 1

## 2017-09-07 MED ORDER — SODIUM CHLORIDE 0.9 % IV SOLN
250.0000 mL | INTRAVENOUS | Status: DC | PRN
  Filled 2017-09-07: qty 250

## 2017-09-07 MED ORDER — IOHEXOL 300 MG/ML  SOLN
INTRAMUSCULAR | Status: DC | PRN
  Administered 2017-09-07: 10 mL via URETHRAL

## 2017-09-07 MED ORDER — FENTANYL CITRATE (PF) 100 MCG/2ML IJ SOLN
25.0000 ug | INTRAMUSCULAR | Status: DC | PRN
  Filled 2017-09-07: qty 1

## 2017-09-07 MED ORDER — LIDOCAINE 2% (20 MG/ML) 5 ML SYRINGE
INTRAMUSCULAR | Status: AC
  Filled 2017-09-07: qty 5

## 2017-09-07 MED ORDER — STERILE WATER FOR IRRIGATION IR SOLN
Status: DC | PRN
  Administered 2017-09-07: 3000 mL via INTRAVESICAL

## 2017-09-07 MED ORDER — FENTANYL CITRATE (PF) 100 MCG/2ML IJ SOLN
INTRAMUSCULAR | Status: DC | PRN
  Administered 2017-09-07: 75 ug via INTRAVENOUS

## 2017-09-07 MED ORDER — SODIUM CHLORIDE 0.9% FLUSH
3.0000 mL | Freq: Two times a day (BID) | INTRAVENOUS | Status: DC
  Filled 2017-09-07: qty 3

## 2017-09-07 SURGICAL SUPPLY — 36 items
BAG DRAIN URO-CYSTO SKYTR STRL (DRAIN) ×4 IMPLANT
BAG URINE DRAINAGE (UROLOGICAL SUPPLIES) ×4 IMPLANT
BALLN NEPHROSTOMY (BALLOONS) ×4
BALLOON NEPHROSTOMY (BALLOONS) ×3 IMPLANT
CATH FOLEY 2WAY SLVR  5CC 20FR (CATHETERS) ×1
CATH FOLEY 2WAY SLVR 5CC 20FR (CATHETERS) ×3 IMPLANT
CLOTH BEACON ORANGE TIMEOUT ST (SAFETY) ×8 IMPLANT
ELECT REM PT RETURN 9FT ADLT (ELECTROSURGICAL)
ELECTRODE REM PT RTRN 9FT ADLT (ELECTROSURGICAL) IMPLANT
FIBER LASER FLEXIVA 365 (UROLOGICAL SUPPLIES) IMPLANT
FIBER LASER TRAC TIP (UROLOGICAL SUPPLIES) IMPLANT
GLOVE BIO SURGEON STRL SZ 6.5 (GLOVE) ×4 IMPLANT
GLOVE BIOGEL PI IND STRL 6.5 (GLOVE) ×3 IMPLANT
GLOVE BIOGEL PI INDICATOR 6.5 (GLOVE) ×1
GLOVE SURG SS PI 8.0 STRL IVOR (GLOVE) ×4 IMPLANT
GOWN STRL REUS W/ TWL LRG LVL3 (GOWN DISPOSABLE) ×3 IMPLANT
GOWN STRL REUS W/ TWL XL LVL3 (GOWN DISPOSABLE) ×3 IMPLANT
GOWN STRL REUS W/TWL LRG LVL3 (GOWN DISPOSABLE) ×1
GOWN STRL REUS W/TWL XL LVL3 (GOWN DISPOSABLE) ×5 IMPLANT
GUIDEWIRE ANG ZIPWIRE 038X150 (WIRE) IMPLANT
GUIDEWIRE STR DUAL SENSOR (WIRE) ×4 IMPLANT
HOLDER FOLEY CATH W/STRAP (MISCELLANEOUS) ×4 IMPLANT
INFUSOR MANOMETER BAG 3000ML (MISCELLANEOUS) ×4 IMPLANT
IV NS IRRIG 3000ML ARTHROMATIC (IV SOLUTION) ×4 IMPLANT
KIT RM TURNOVER CYSTO AR (KITS) ×4 IMPLANT
MANIFOLD NEPTUNE II (INSTRUMENTS) ×4 IMPLANT
NDL SAFETY ECLIPSE 18X1.5 (NEEDLE) IMPLANT
NEEDLE HYPO 18GX1.5 SHARP (NEEDLE)
NEEDLE HYPO 22GX1.5 SAFETY (NEEDLE) IMPLANT
NS IRRIG 500ML POUR BTL (IV SOLUTION) ×4 IMPLANT
PACK CYSTO (CUSTOM PROCEDURE TRAY) ×4 IMPLANT
PLUG CATH AND CAP STER (CATHETERS) IMPLANT
SYR 20CC LL (SYRINGE) ×4 IMPLANT
SYRINGE IRR TOOMEY STRL 70CC (SYRINGE) IMPLANT
TUBE CONNECTING 12X1/4 (SUCTIONS) IMPLANT
WATER STERILE IRR 3000ML UROMA (IV SOLUTION) ×4 IMPLANT

## 2017-09-07 NOTE — Anesthesia Procedure Notes (Signed)
Procedure Name: LMA Insertion Date/Time: 09/07/2017 10:13 AM Performed by: Suan Halter, CRNA Pre-anesthesia Checklist: Patient identified, Emergency Drugs available, Suction available and Patient being monitored Patient Re-evaluated:Patient Re-evaluated prior to induction Oxygen Delivery Method: Circle system utilized Preoxygenation: Pre-oxygenation with 100% oxygen Induction Type: IV induction Ventilation: Mask ventilation without difficulty LMA: LMA inserted LMA Size: 4.0 Number of attempts: 1 Airway Equipment and Method: Bite block Placement Confirmation: positive ETCO2 Tube secured with: Tape Dental Injury: Teeth and Oropharynx as per pre-operative assessment

## 2017-09-07 NOTE — Op Note (Signed)
Procedure: 1.  Cystoscopy with bilateral retrograde pyelograms and interpretation. 2.  Transurethral resection of 8 mm left trigone tumor. 3.  Transurethral resection of 3-4 cm right lateral wall and trigone tumor. 4.  Instillation of epirubicin in PACU.  Preop diagnosis: Large right trigone bladder tumor and small left trigone bladder tumor.  Postop diagnosis: 3-4 cm right lateral wall and trigone tumor and 8 mm left trigone tumor.  Surgeon: Dr. [name].  Anesthesia: General.  Specimen: 1.  Left trigone tumor.  2 right lateral wall and trigone tumor chips.  EBL: Minimal.  Drain: 20 French Foley catheter.  Complications: None.  Indications: [name] is a 77 year old white male who presented with gross hematuria and was found on office cystoscopy to have a tumor of the right trigone and lateral wall with possible involvement of the ureteral orifice.  There was a second small tumor lateral to the left ureteral orifice.  It was felt that cystoscopy with retrograde pyelography, transurethral resection of bladder tumor, possible stent and instillation of epirubicin was indicated.  Procedure: He was taken to the operating room where he was given 2 g of Ancef.  A general anesthetic was induced.  He was placed in the lithotomy position and fitted with PAS hose.  His perineum and genitalia were prepped with Betadine solution and he was draped in usual sterile fashion.  Cystoscopy was performed using the 23 [place] scope and 30 degree lens.   In order to insert the cystoscope   The urethra was dilated to 53 [place] with [employer] sounds because of some meatal stenosis.  Examination demonstrated a normal urethra proximal to the meatus.  The external sphincter was intact.  The prostatic urethra was approximately 2 cm in length with mild trilobar hyperplasia with minimal obstruction.  Inspection of the bladder revealed mild trabeculation.  Ureteral orifices were in the normal anatomic position  effluxing clear urine.  There was an 8 mm papillary tumor approximately 1 cm lateral to the left ureteral orifice and there was approximately a 3-4 cm papillary tumor about 1 cm lateral to the right ureteral orifice extending onto the lateral wall of the bladder.  There were some small satellite tumors at the base of the bladder and there was a small fresh clot which was subsequently evacuated.  Bilateral retrograde pyelography was performed using a 5 French opening catheter and Omnipaque.  The right retrograde pyelogram revealed a delicate but otherwise normal right ureter and intrarenal collecting system.  The left retrograde pyelogram revealed a delicate but otherwise normal left ureter and intrarenal collecting system.  After completion of the retrograde pyelograms, a 26 French continuous-flow resectoscope sheath was inserted with the aid of the visual obturator.  The visual obturator was replaced with an Beatrix Fetters handle with a bipolar loop and 30 degree lens.  Saline was used as the irrigant.  The small tumor lateral to the left ureteral orifice was resected and the specimen was retrieved.  The resection bed was fulgurated and inspection revealed no ureteral injury.  The right lateral tumor was then resected down to the muscular fibers with fulguration of the surrounding mucosa and small tumors at the base of the bladder.  The bladder was evacuated free of the tumor material.  The resection bed was then generally fulgurated along with any visible abnormal mucosa.  The right ureteral orifice was spared.  Final inspection revealed no active bleeding or retained material.  The resectoscope was removed and a 20 French Foley catheter was inserted  with the aid of a catheter guide.  The balloon was filled with 10 cc of sterile fluid.  The catheter was placed to straight drainage and the patient was taken down from the lithotomy position.  His anesthetic was reversed and he was moved to recovery room in  stable condition.  There were no complications during this portion of procedure  Once in the PACU he underwent instillation of epirubicin 50 mg and 50 mL of diluent.  The catheter was plugged for 1 hour and then drained.  He was discharged home with the catheter.  There were no complications.

## 2017-09-07 NOTE — Discharge Instructions (Addendum)
CYSTOSCOPY HOME CARE INSTRUCTIONS  Activity: Rest for the remainder of the day.  Do not drive or operate equipment today.  You may resume normal activities in one to two days as instructed by your physician.   Meals: Drink plenty of liquids and eat light foods such as gelatin or soup this evening.  You may return to a normal meal plan tomorrow.  Return to Work: You may return to work in one to two days or as instructed by your physician.  Special Instructions / Symptoms: Call your physician if any of these symptoms occur:   -persistent or heavy bleeding  -bleeding which continues after first few urination  -large blood clots that are difficult to pass  -urine stream diminishes or stops completely  -fever equal to or higher than 101 degrees Farenheit.  -cloudy urine with a strong, foul odor  -severe pain  Females should always wipe from front to back after elimination.  You may feel some burning pain when you urinate.  This should disappear with time.  Applying moist heat to the lower abdomen or a hot tub bath may help relieve the pain. \  You may remove the catheter at home in the morning.     Indwelling Urinary Catheter Care, Adult Take good care of your catheter to keep it working and to prevent problems. How to wear your catheter Attach your catheter to your leg with tape (adhesive tape) or a leg strap. Make sure it is not too tight. If you use tape, remove any bits of tape that are already on the catheter. How to wear a drainage bag You should have:  A large overnight bag.  A small leg bag.  Overnight Bag You may wear the overnight bag at any time. Always keep the bag below the level of your bladder but off the floor. When you sleep, put a clean plastic bag in a wastebasket. Then hang the bag inside the wastebasket. Leg Bag Never wear the leg bag at night. Always wear the leg bag below your knee. Keep the leg bag secure with a leg strap or tape. How to care for your  skin  Clean the skin around the catheter at least once every day.  Shower every day. Do not take baths.  Put creams, lotions, or ointments on your genital area only as told by your doctor.  Do not use powders, sprays, or lotions on your genital area. How to clean your catheter and your skin 1. Wash your hands with soap and water. 2. Wet a washcloth in warm water and gentle (mild) soap. 3. Use the washcloth to clean the skin where the catheter enters your body. Clean downward and wipe away from the catheter in small circles. Do not wipe toward the catheter. 4. Pat the area dry with a clean towel. Make sure to clean off all soap. How to care for your drainage bags Empty your drainage bag when it is ?- full or at least 2-3 times a day. Replace your drainage bag once a month or sooner if it starts to smell bad or look dirty. Do not clean your drainage bag unless told by your doctor. Emptying a drainage bag  Supplies Needed  Rubbing alcohol.  Gauze pad or cotton ball.  Tape or a leg strap.  Steps 1. Wash your hands with soap and water. 2. Separate (detach) the bag from your leg. 3. Hold the bag over the toilet or a clean container. Keep the bag below your hips  and bladder. This stops pee (urine) from going back into the tube. 4. Open the pour spout at the bottom of the bag. 5. Empty the pee into the toilet or container. Do not let the pour spout touch any surface. 6. Put rubbing alcohol on a gauze pad or cotton ball. 7. Use the gauze pad or cotton ball to clean the pour spout. 8. Close the pour spout. 9. Attach the bag to your leg with tape or a leg strap. 10. Wash your hands.  Changing a drainage bag Supplies Needed  Alcohol wipes.  A clean drainage bag.  Adhesive tape or a leg strap.  Steps 1. Wash your hands with soap and water. 2. Separate the dirty bag from your leg. 3. Pinch the rubber catheter with your fingers so that pee does not spill out. 4. Separate the  catheter tube from the drainage tube where these tubes connect (at the connection valve). Do not let the tubes touch any surface. 5. Clean the end of the catheter tube with an alcohol wipe. Use a different alcohol wipe to clean the end of the drainage tube. 6. Connect the catheter tube to the drainage tube of the clean bag. 7. Attach the new bag to the leg with adhesive tape or a leg strap. 8. Wash your hands.  How to prevent infection and other problems  Never pull on your catheter or try to remove it. Pulling can damage tissue in your body.  Always wash your hands before and after touching your catheter.  If a leg strap gets wet, replace it with a dry one.  Drink enough fluids to keep your pee clear or pale yellow, or as told by your doctor.  Do not let the drainage bag or tubing touch the floor.  Wear cotton underwear.  If you are male, wipe from front to back after you poop (have a bowel movement).  Check on the catheter often to make sure it works and the tubing is not twisted. Get help if:  Your pee is cloudy.  Your pee smells unusually bad.  Your pee is not draining into the bag.  Your tube gets clogged.  Your catheter starts to leak.  Your bladder feels full. Get help right away if:  You have redness, swelling, or pain where the catheter enters your body.  You have fluid, pus, or a bad smell coming from the area where the catheter enters your body.  The area where the catheter enters your body feels warm.  You have a fever.  You have pain in your: ? Stomach (abdomen). ? Legs. ? Lower back. ? Bladder.  You see blood fill the catheter.  Your pee is pink or red.  You feel sick to your stomach (nauseous).  You throw up (vomit).  You have chills.  Your catheter gets pulled out. This information is not intended to replace advice given to you by your health care provider. Make sure you discuss any questions you have with your health care  provider. Document Released: 12/10/2012 Document Revised: 07/13/2016 Document Reviewed: 01/28/2014 Elsevier Interactive Patient Education  2018 [place] [facility]est[facility] Home Care Instructions  Activity: Get plenty of rest for the remainder of the day. A responsible individual must stay with you for 24 hours following the procedure.  For the next 24 hours, DO NOT: -Drive a car -Paediatric nurse -Drink alcoholic beverages -Take any medication unless instructed by your physician -Make any legal decisions or sign important  papers.  Meals: Start with liquid foods such as gelatin or soup. Progress to regular foods as tolerated. Avoid greasy, spicy, heavy foods. If nausea and/or vomiting occur, drink only clear liquids until the nausea and/or vomiting subsides. Call your physician if vomiting continues.  Special Instructions/Symptoms: Your throat may feel dry or sore from the anesthesia or the breathing tube placed in your throat during surgery. If this causes discomfort, gargle with warm salt water. The discomfort should disappear within 24 hours.  If you had a scopolamine patch placed behind your ear for the management of post- operative nausea and/or vomiting:  1. The medication in the patch is effective for 72 hours, after which it should be removed.  Wrap patch in a tissue and discard in the trash. Wash hands thoroughly with soap and water. 2. You may remove the patch earlier than 72 hours if you experience unpleasant side effects which may include dry mouth, dizziness or visual disturbances. 3. Avoid touching the patch. Wash your hands with soap and water after contact with the patch.

## 2017-09-07 NOTE — Anesthesia Preprocedure Evaluation (Signed)
Anesthesia Evaluation  Patient identified by MRN, date of birth, ID band Patient awake    Reviewed: Allergy & Precautions, NPO status , Patient's Chart, lab work & pertinent test results, reviewed documented beta blocker date and time   Airway Mallampati: III  TM Distance: >3 FB Neck ROM: Full    Dental  (+) Upper Dentures, Lower Dentures   Pulmonary former smoker,    Pulmonary exam normal breath sounds clear to auscultation       Cardiovascular hypertension, Pt. on home beta blockers and Pt. on medications + CAD and + Cardiac Stents  Normal cardiovascular exam Rhythm:Regular Rate:Normal  ECG: NSR, rate 62  Blood pressure demonstrated a hypertensive response to exercise. There was no ST segment deviation noted during stress. No T wave inversion was noted during stress. Hypertensive response to exercise and reduced exercise capacity. No ECG evidence of stress induced ischemia.   Neuro/Psych negative neurological ROS  negative psych ROS   GI/Hepatic negative GI ROS, Neg liver ROS,   Endo/Other  negative endocrine ROS  Renal/GU negative Renal ROS   bladder tumor stricture    Musculoskeletal negative musculoskeletal ROS (+)   Abdominal   Peds  Hematology HLD   Anesthesia Other Findings   Reproductive/Obstetrics                             Anesthesia Physical Anesthesia Plan  ASA: III  Anesthesia Plan: General   Post-op Pain Management:    Induction: Intravenous  PONV Risk Score and Plan: 2 and Dexamethasone, Ondansetron and Treatment may vary due to age or medical condition  Airway Management Planned: LMA  Additional Equipment:   Intra-op Plan:   Post-operative Plan: Extubation in OR  Informed Consent: I have reviewed the patients History and Physical, chart, labs and discussed the procedure including the risks, benefits and alternatives for the proposed anesthesia with the  patient or authorized representative who has indicated his/her understanding and acceptance.   Dental advisory given  Plan Discussed with: CRNA  Anesthesia Plan Comments:         Anesthesia Quick Evaluation

## 2017-09-07 NOTE — Interval H&P Note (Signed)
History and Physical Interval Note:  09/07/2017 9:48 AM  Jordan Chambers  has presented today for surgery, with the diagnosis of bladder tumor,stricture  The various methods of treatment have been discussed with the patient and family. After consideration of risks, benefits and other options for treatment, the patient has consented to  Procedure(s): TRANSURETHRAL RESECTION OF BLADDER TUMOR POSSIBLE EPIRUBICIN(TURBT) (N/A) CYSTOSCOPY WITH BILATERAL RETROGRADE PYELOGRAM (Bilateral) CYSTOSCOPY WITH URETHRAL DILATATION (N/A) as a surgical intervention .  The patient's history has been reviewed, patient examined, no change in status, stable for surgery.  I have reviewed the patient's chart and labs.  Questions were answered to the patient's satisfaction.     [name]

## 2017-09-07 NOTE — Anesthesia Postprocedure Evaluation (Signed)
Anesthesia Post Note  Patient: Jordan Chambers  Procedure(s) Performed: TRANSURETHRAL RESECTION OF BLADDER TUMOR  EPIRUBICIN(TURBT) (N/A Bladder) CYSTOSCOPY WITH BILATERAL RETROGRADE PYELOGRAM (Bilateral Ureter) CYSTOSCOPY WITH URETHRAL DILATATION (N/A Urethra)     Patient location during evaluation: PACU Anesthesia Type: General Level of consciousness: awake and alert Pain management: pain level controlled Vital Signs Assessment: post-procedure vital signs reviewed and stable Respiratory status: spontaneous breathing, nonlabored ventilation, respiratory function stable and patient connected to nasal cannula oxygen Cardiovascular status: blood pressure returned to baseline and stable Postop Assessment: no apparent nausea or vomiting Anesthetic complications: no    Last Vitals:  Vitals:   09/07/17 1345 09/07/17 1440  BP: (!) 107/52 130/60  Pulse: 69 70  Resp: 14 16  Temp:  36.6 C  SpO2: 91% 92%    Last Pain:  Vitals:   09/07/17 1440  TempSrc: Oral                 Ryan P Ellender

## 2017-09-07 NOTE — Transfer of Care (Signed)
Immediate Anesthesia Transfer of Care Note  Patient: Jordan Chambers  Procedure(s) Performed: TRANSURETHRAL RESECTION OF BLADDER TUMOR  EPIRUBICIN(TURBT) (N/A Bladder) CYSTOSCOPY WITH BILATERAL RETROGRADE PYELOGRAM (Bilateral ) CYSTOSCOPY WITH URETHRAL DILATATION (N/A )  Patient Location: PACU  Anesthesia Type:General  Level of Consciousness: sedated  Airway & Oxygen Therapy: Patient Spontanous Breathing and Patient connected to nasal cannula oxygen  Post-op Assessment: Report given to RN  Post vital signs: Reviewed and stable  Last Vitals:  Vitals:   09/07/17 0904  BP: (!) 157/63  Pulse: 61  Resp: 16  Temp: (!) 36.3 C  SpO2: 95%    Last Pain:  Vitals:   09/07/17 0904  TempSrc: Oral      Patients Stated Pain Goal: 8 (34/37/35 7897)  Complications: No apparent anesthesia complications

## 2017-09-08 ENCOUNTER — Encounter (HOSPITAL_BASED_OUTPATIENT_CLINIC_OR_DEPARTMENT_OTHER): Payer: Self-pay | Admitting: Urology

## 2017-09-18 DIAGNOSIS — I10 Essential (primary) hypertension: Secondary | ICD-10-CM | POA: Diagnosis not present

## 2017-09-18 DIAGNOSIS — Z0000 Encounter for general adult medical examination without abnormal findings: Secondary | ICD-10-CM | POA: Diagnosis not present

## 2017-09-18 DIAGNOSIS — E7800 Pure hypercholesterolemia, unspecified: Secondary | ICD-10-CM | POA: Diagnosis not present

## 2017-09-18 DIAGNOSIS — Z125 Encounter for screening for malignant neoplasm of prostate: Secondary | ICD-10-CM | POA: Diagnosis not present

## 2017-09-18 DIAGNOSIS — R749 Abnormal serum enzyme level, unspecified: Secondary | ICD-10-CM | POA: Diagnosis not present

## 2017-09-21 DIAGNOSIS — E7800 Pure hypercholesterolemia, unspecified: Secondary | ICD-10-CM | POA: Diagnosis not present

## 2017-09-21 DIAGNOSIS — Z0001 Encounter for general adult medical examination with abnormal findings: Secondary | ICD-10-CM | POA: Diagnosis not present

## 2017-09-21 DIAGNOSIS — R3912 Poor urinary stream: Secondary | ICD-10-CM | POA: Diagnosis not present

## 2017-09-21 DIAGNOSIS — I2510 Atherosclerotic heart disease of native coronary artery without angina pectoris: Secondary | ICD-10-CM | POA: Diagnosis not present

## 2017-09-21 DIAGNOSIS — C678 Malignant neoplasm of overlapping sites of bladder: Secondary | ICD-10-CM | POA: Diagnosis not present

## 2017-09-21 DIAGNOSIS — N401 Enlarged prostate with lower urinary tract symptoms: Secondary | ICD-10-CM | POA: Diagnosis not present

## 2017-09-21 DIAGNOSIS — J449 Chronic obstructive pulmonary disease, unspecified: Secondary | ICD-10-CM | POA: Diagnosis not present

## 2017-09-23 IMAGING — US US ABDOMEN COMPLETE
1 series · 13 of 25 positions shown · non-contrast
Comparison: CT scan of the abdomen pelvis of July 13, 2005

CLINICAL DATA: Three weeks of back pain without nausea vomiting or
diarrhea.

EXAM:
ABDOMEN ULTRASOUND COMPLETE

[Series 1: us abdomen complete · 0.26mm/px · 13 of 73 slices shown]
[im 1/73]
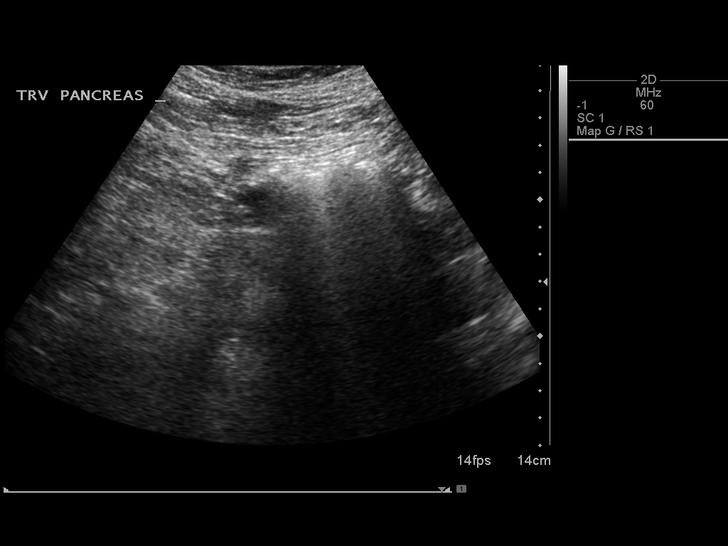
[im 7/73]
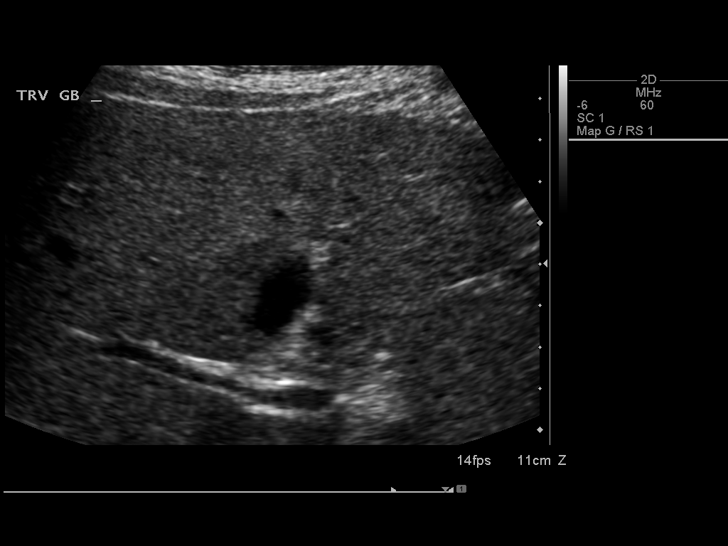
[im 13/73]
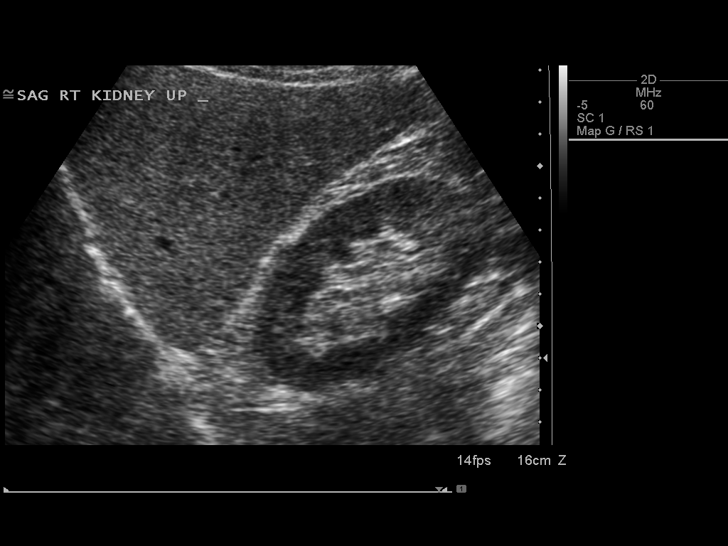
[im 19/73]
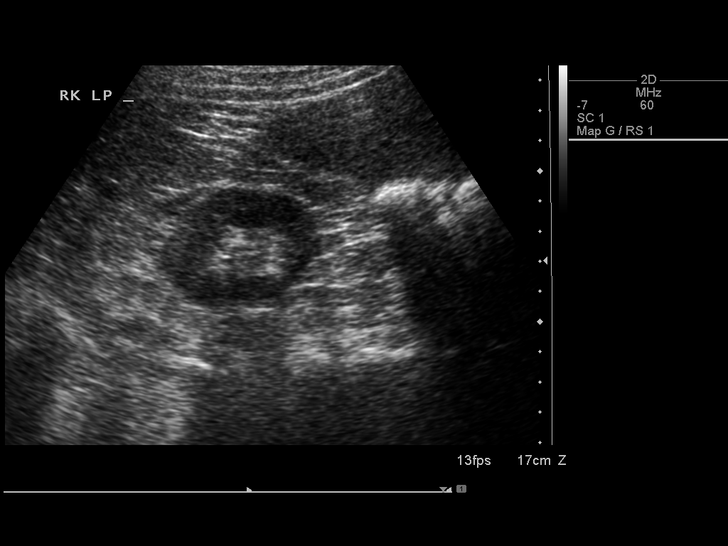
[im 25/73]
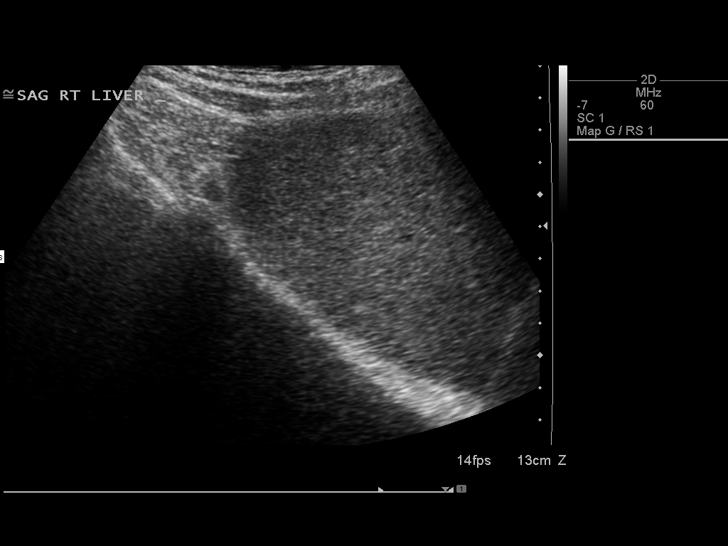
[im 31/73]
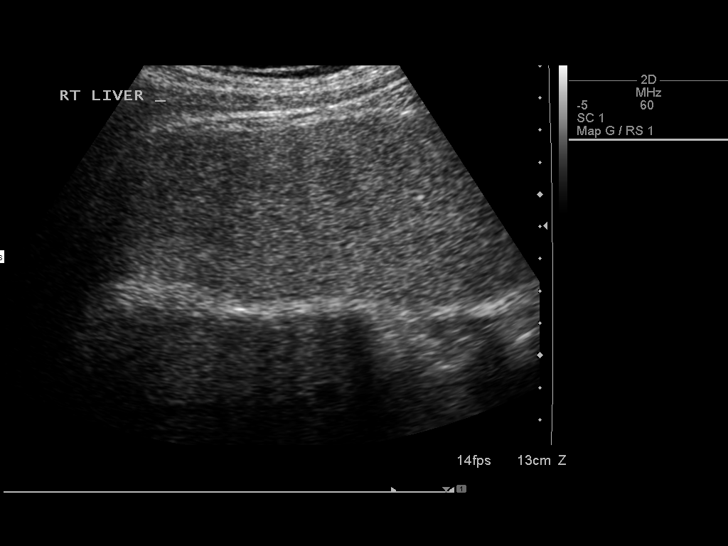
[im 37/73]
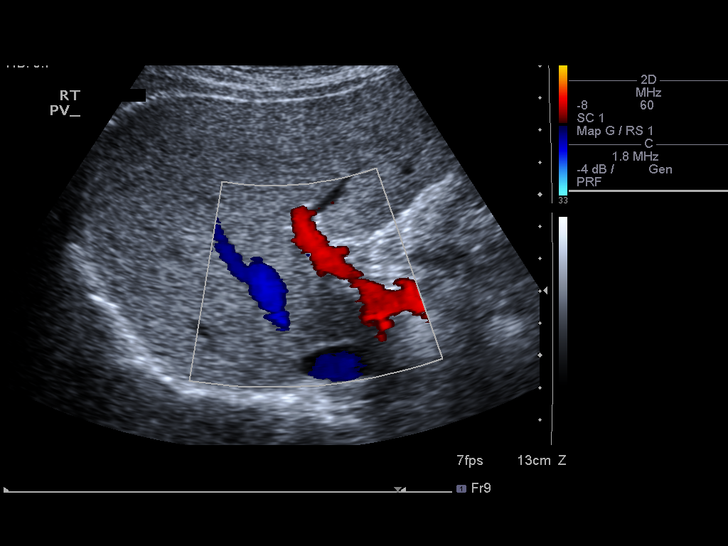
[im 43/73]
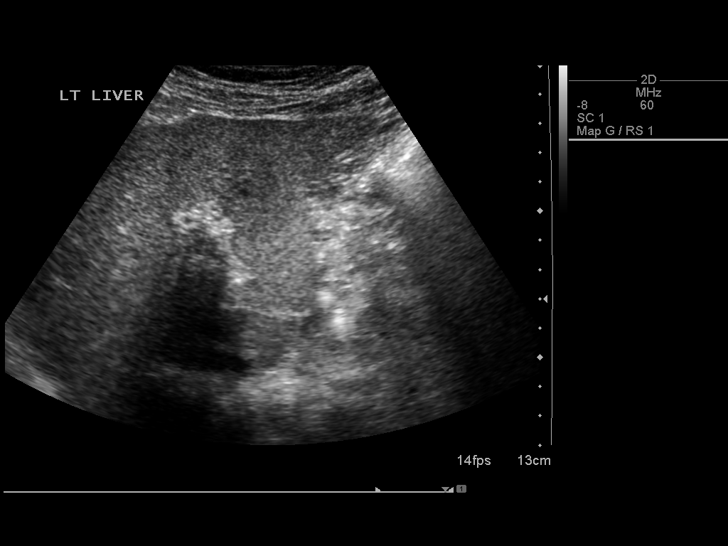
[im 49/73]
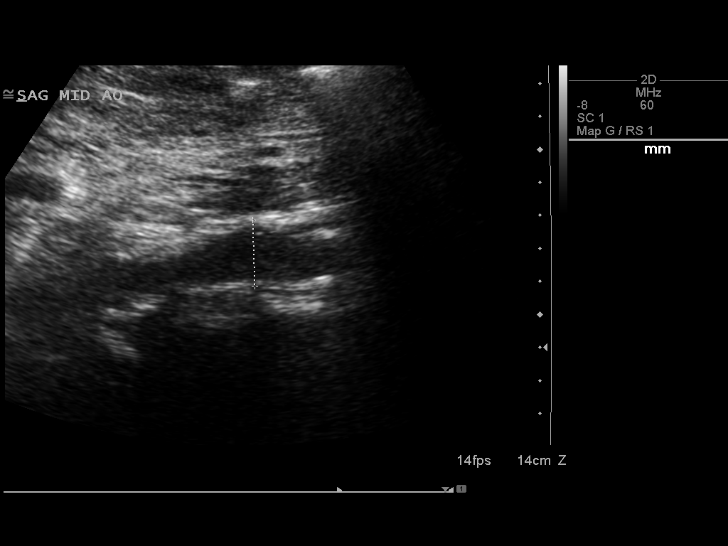
[im 55/73]
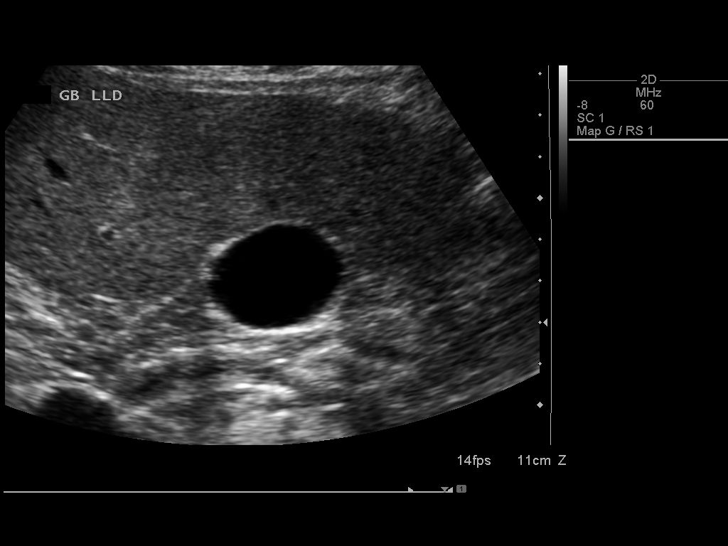
[im 61/73]
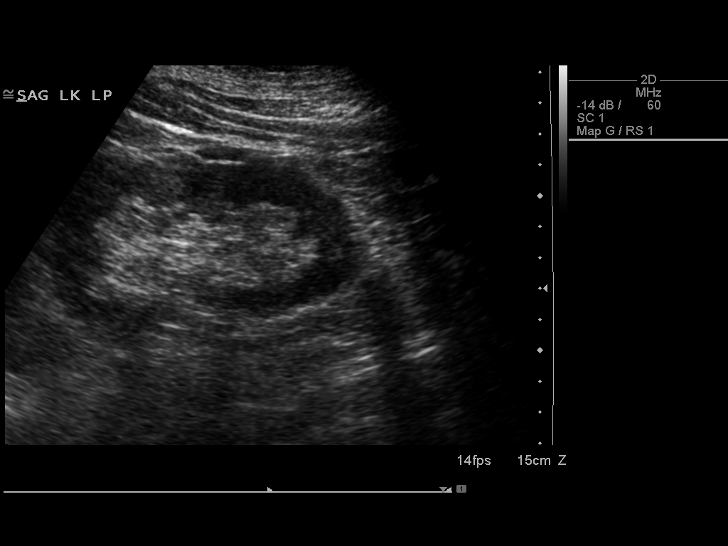
[im 67/73]
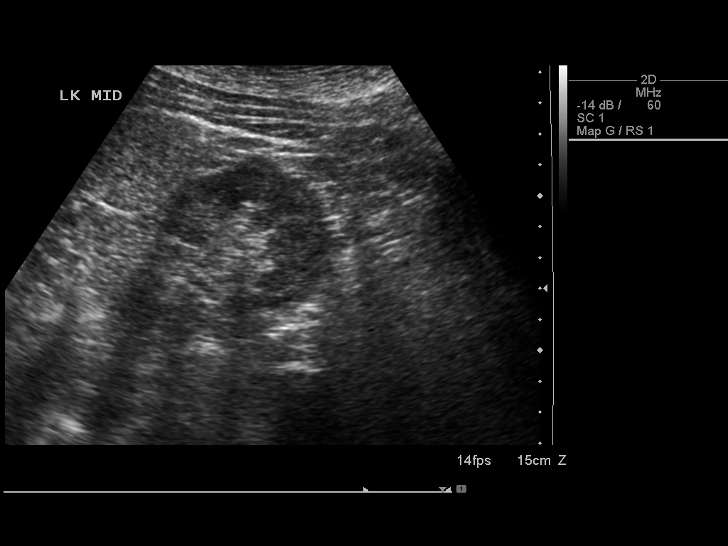
[im 73/73]
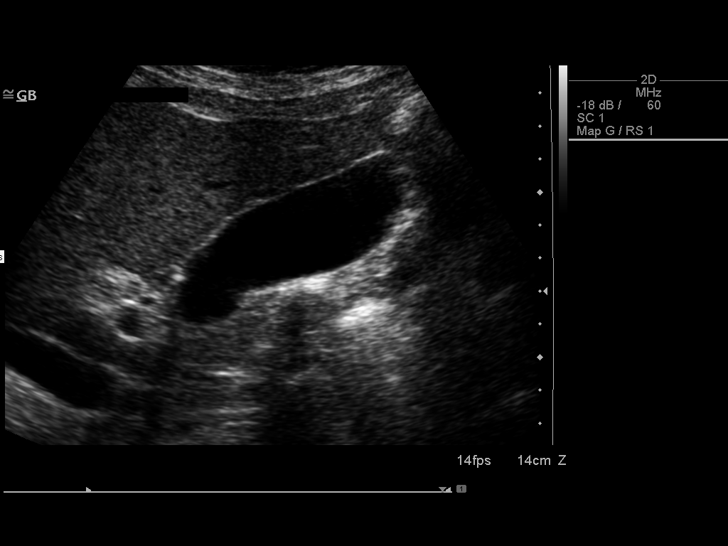

[13 of 25 positions shown; findings below may reference images not displayed]

FINDINGS: Gallbladder: No gallstones or wall thickening visualized. No
sonographic Murphy sign noted by sonographer.

Common bile duct: Diameter: 2.5 mm

Liver: The hepatic echotexture is heterogeneous. There is no focal
mass or ductal dilation. The surface contour of the liver remains
fairly smooth.

IVC: Visualization is limited by bowel gas.

Pancreas: The pancreas is obscured by bowel gas.

Spleen: Size and appearance within normal limits.

Right Kidney: Length: 9.6 cm. Echogenicity within normal limits. No
mass or hydronephrosis visualized.

Left Kidney: Length: 10.5 cm. Echogenicity within normal limits. No
mass or hydronephrosis visualized.

Abdominal aorta: The visualized abdominal aorta appears normal. The
bifurcation is obscured by bowel gas.

Other findings: There is no ascites.
IMPRESSION: No gallstones or sonographic evidence of acute cholecystitis. If
there are clinical concerns of chronic cholecystitis, a nuclear
medicine hepatobiliary scan with gallbladder ejection fraction
determination may be useful.

Heterogeneous hepatic echotexture likely reflects early fatty
infiltrative change.

Bowel gas limits evaluation of the pancreas, inferior vena cava, and
distal abdominal aorta.

## 2017-10-03 DIAGNOSIS — Z5111 Encounter for antineoplastic chemotherapy: Secondary | ICD-10-CM | POA: Diagnosis not present

## 2017-10-03 DIAGNOSIS — C678 Malignant neoplasm of overlapping sites of bladder: Secondary | ICD-10-CM | POA: Diagnosis not present

## 2017-10-09 DIAGNOSIS — I10 Essential (primary) hypertension: Secondary | ICD-10-CM | POA: Diagnosis not present

## 2017-10-10 DIAGNOSIS — Z5111 Encounter for antineoplastic chemotherapy: Secondary | ICD-10-CM | POA: Diagnosis not present

## 2017-10-10 DIAGNOSIS — C678 Malignant neoplasm of overlapping sites of bladder: Secondary | ICD-10-CM | POA: Diagnosis not present

## 2017-10-17 DIAGNOSIS — C678 Malignant neoplasm of overlapping sites of bladder: Secondary | ICD-10-CM | POA: Diagnosis not present

## 2017-10-17 DIAGNOSIS — Z5111 Encounter for antineoplastic chemotherapy: Secondary | ICD-10-CM | POA: Diagnosis not present

## 2017-10-24 DIAGNOSIS — Z5111 Encounter for antineoplastic chemotherapy: Secondary | ICD-10-CM | POA: Diagnosis not present

## 2017-10-24 DIAGNOSIS — C678 Malignant neoplasm of overlapping sites of bladder: Secondary | ICD-10-CM | POA: Diagnosis not present

## 2017-10-31 DIAGNOSIS — C678 Malignant neoplasm of overlapping sites of bladder: Secondary | ICD-10-CM | POA: Diagnosis not present

## 2017-10-31 DIAGNOSIS — Z5111 Encounter for antineoplastic chemotherapy: Secondary | ICD-10-CM | POA: Diagnosis not present

## 2017-11-07 DIAGNOSIS — Z5111 Encounter for antineoplastic chemotherapy: Secondary | ICD-10-CM | POA: Diagnosis not present

## 2017-11-07 DIAGNOSIS — C678 Malignant neoplasm of overlapping sites of bladder: Secondary | ICD-10-CM | POA: Diagnosis not present

## 2018-01-24 DIAGNOSIS — Z8551 Personal history of malignant neoplasm of bladder: Secondary | ICD-10-CM | POA: Diagnosis not present

## 2018-01-24 DIAGNOSIS — N401 Enlarged prostate with lower urinary tract symptoms: Secondary | ICD-10-CM | POA: Diagnosis not present

## 2018-01-24 DIAGNOSIS — R3912 Poor urinary stream: Secondary | ICD-10-CM | POA: Diagnosis not present

## 2018-04-23 DIAGNOSIS — Z8551 Personal history of malignant neoplasm of bladder: Secondary | ICD-10-CM | POA: Diagnosis not present

## 2018-05-14 DIAGNOSIS — Z5111 Encounter for antineoplastic chemotherapy: Secondary | ICD-10-CM | POA: Diagnosis not present

## 2018-05-14 DIAGNOSIS — C678 Malignant neoplasm of overlapping sites of bladder: Secondary | ICD-10-CM | POA: Diagnosis not present

## 2018-05-21 DIAGNOSIS — C678 Malignant neoplasm of overlapping sites of bladder: Secondary | ICD-10-CM | POA: Diagnosis not present

## 2018-05-21 DIAGNOSIS — Z5111 Encounter for antineoplastic chemotherapy: Secondary | ICD-10-CM | POA: Diagnosis not present

## 2018-05-28 DIAGNOSIS — C678 Malignant neoplasm of overlapping sites of bladder: Secondary | ICD-10-CM | POA: Diagnosis not present

## 2018-05-28 DIAGNOSIS — Z5111 Encounter for antineoplastic chemotherapy: Secondary | ICD-10-CM | POA: Diagnosis not present

## 2018-07-08 ENCOUNTER — Encounter (HOSPITAL_BASED_OUTPATIENT_CLINIC_OR_DEPARTMENT_OTHER): Payer: Self-pay | Admitting: *Deleted

## 2018-07-08 ENCOUNTER — Other Ambulatory Visit: Payer: Self-pay

## 2018-07-08 ENCOUNTER — Emergency Department (HOSPITAL_BASED_OUTPATIENT_CLINIC_OR_DEPARTMENT_OTHER)
Admission: EM | Admit: 2018-07-08 | Discharge: 2018-07-08 | Disposition: A | Discharge status: Home / Self Care | Payer: Medicare Other | Attending: Emergency Medicine | Admitting: Emergency Medicine

## 2018-07-08 DIAGNOSIS — Y9389 Activity, other specified: Secondary | ICD-10-CM | POA: Diagnosis not present

## 2018-07-08 DIAGNOSIS — Y999 Unspecified external cause status: Secondary | ICD-10-CM | POA: Diagnosis not present

## 2018-07-08 DIAGNOSIS — Z87891 Personal history of nicotine dependence: Secondary | ICD-10-CM | POA: Diagnosis not present

## 2018-07-08 DIAGNOSIS — Y92018 Other place in single-family (private) house as the place of occurrence of the external cause: Secondary | ICD-10-CM | POA: Insufficient documentation

## 2018-07-08 DIAGNOSIS — X58XXXA Exposure to other specified factors, initial encounter: Secondary | ICD-10-CM | POA: Diagnosis not present

## 2018-07-08 DIAGNOSIS — I259 Chronic ischemic heart disease, unspecified: Secondary | ICD-10-CM | POA: Diagnosis not present

## 2018-07-08 DIAGNOSIS — I10 Essential (primary) hypertension: Secondary | ICD-10-CM | POA: Insufficient documentation

## 2018-07-08 DIAGNOSIS — Z79899 Other long term (current) drug therapy: Secondary | ICD-10-CM | POA: Diagnosis not present

## 2018-07-08 DIAGNOSIS — H1031 Unspecified acute conjunctivitis, right eye: Secondary | ICD-10-CM

## 2018-07-08 DIAGNOSIS — J449 Chronic obstructive pulmonary disease, unspecified: Secondary | ICD-10-CM | POA: Insufficient documentation

## 2018-07-08 DIAGNOSIS — T1591XA Foreign body on external eye, part unspecified, right eye, initial encounter: Secondary | ICD-10-CM | POA: Diagnosis present

## 2018-07-08 MED ORDER — POLYMYXIN B-TRIMETHOPRIM 10000-0.1 UNIT/ML-% OP SOLN
1.0000 [drp] | OPHTHALMIC | Status: DC
  Filled 2018-07-08: qty 10

## 2018-07-08 MED ORDER — FLUORESCEIN SODIUM 1 MG OP STRP
1.0000 | ORAL_STRIP | Freq: Once | OPHTHALMIC | Status: AC
  Administered 2018-07-08: 1 via OPHTHALMIC
  Filled 2018-07-08: qty 1

## 2018-07-08 MED ORDER — TETRACAINE HCL 0.5 % OP SOLN
1.0000 [drp] | Freq: Once | OPHTHALMIC | Status: AC
  Administered 2018-07-08: 1 [drp] via OPHTHALMIC
  Filled 2018-07-08: qty 4

## 2018-07-08 NOTE — Discharge Instructions (Signed)
Use the drops every 4 hours while awake.  Try not to rub your eye.

## 2018-07-08 NOTE — ED Provider Notes (Signed)
[facility] EMERGENCY DEPARTMENT Provider Note   CSN: [id number] Arrival date & time: 07/08/18  1928     History   Chief Complaint Chief Complaint  Patient presents with  . Foreign Body in [place] is a 77 y.o. male.  The history is provided by the patient.  Foreign Body in Eye  This is a new problem. The current episode started 3 to 5 hours ago. The problem occurs constantly. The problem has been gradually worsening. Associated symptoms comments: Now doesn't feel so much like something in the eye but eye is watering and matting.  Did not see anything fly into his eye.  Was not rubbing it when it started.  Was inside watching TV when it started.  He denies vision changes.  No contacts.. Nothing aggravates the symptoms. Nothing relieves the symptoms. He has tried nothing for the symptoms. The treatment provided no relief.    Past Medical History:  Diagnosis Date  . Bladder tumor   . CAD (coronary artery disease) cardiologist-  dr croitoru    08/ 2000  Cardiac cath w/ stenting x3 (per pt. one vessel);  12/ 2008 cardiac cath w/ angioplasty for in-stent restenosis  . Congestion of throat 09-05-2017  received call back from pt today 1349 , pt had seen his pcp , was told just his sinuses   09-05-2017 per pt congestion in throat , cough, occasion productive yellow, no fever (advised pt to see his pcp prior to surgery 09-07-2017)  . COPD (chronic obstructive pulmonary disease) ([place])   . Diverticulosis of colon   . Full dentures   . Gross hematuria   . History of adenomatous polyp of colon    2007; 2012; 2015  . History of basal cell carcinoma (BCC) excision    05/ 2014  nose  . History of exercise stress test 09-08-2016   dr croitoru   no evidence of stress induced ischmeia;  per dr croitoru note did not show evidence of any new blockages, blood pressure did increase excessively w/ exercise (not unexpected since we stopped metoprolol for the test)  .  Hyperlipidemia   . Hypertension   . Peripheral arterial occlusive disease [facility])    09/ 2009  dx right SFA occlusion by dx dr Scot Dock  . Pulmonary nodule    RLL in 2001  . S/P coronary artery stent placement 03/1999   one vessel PCI and stents x3  . 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 Wears glasses     Patient Active Problem List   Diagnosis Date Noted  . PAD (peripheral artery disease) ([place]) 09/02/2016  . HTN (hypertension) 08/24/2016  . COPD (chronic obstructive pulmonary disease) ([place]) 08/24/2016  . Colon polyps 08/24/2016  . Hypercholesteremia 08/24/2016  . Gout 08/24/2016  . Rocky Mountain spotted fever 08/24/2016  . Basal cell adenoma 08/24/2016  . Degenerative joint disease (DJD) of hip 03/03/2014    Past Surgical History:  Procedure Laterality Date  . APPENDECTOMY  1971  . CARDIOVASCULAR STRESS TEST  07-08-2010  dr croitoru   normal perfusion nuclear study w/ no evicende ischemia/  normal LV function and wall motion , ef 70%  . COLONOSCOPY  last one 04-02-2014  . CORONARY ANGIOPLASTY  12/ 2000   dr tysinger   angioplasty for in-stent restenosis  . CORONARY ANGIOPLASTY WITH STENT PLACEMENT  08/ 2000  dr tysinger   stenting x3  (per pt one vessel)  . CYSTOSCOPY W/ RETROGRADES Bilateral  09/07/2017   Procedure: CYSTOSCOPY WITH BILATERAL RETROGRADE PYELOGRAM;  Surgeon: [name], MD;  [facility];  Service: Urology;  Laterality: Bilateral;  . CYSTOSCOPY WITH URETHRAL DILATATION N/A 09/07/2017   Procedure: CYSTOSCOPY WITH URETHRAL DILATATION;  Surgeon: [name], MD;  Location[facility];  Service: Urology;  Laterality: N/A;  . TONSILLECTOMY  1951  . TRANSURETHRAL RESECTION OF BLADDER TUMOR N/A 09/07/2017   Procedure: TRANSURETHRAL RESECTION OF BLADDER TUMOR  EPIRUBICIN(TURBT);  Surgeon: [name], MD;  Location: [facility];  Service: Urology;  Laterality: N/A;  . [place] Medications    Prior to Admission medications   Medication Sig Start Date End Date Taking? Authorizing Provider  amLODipine (NORVASC) 10 MG tablet Take 10 mg by mouth every morning.  06/28/16  Yes [provider]  aspirin EC 81 MG tablet Take 81 mg by mouth daily.   Yes [provider]  irbesartan (AVAPRO) 150 MG tablet Take 150 mg by mouth every morning.  06/28/16  Yes [provider]  metoprolol succinate (TOPROL-XL) 100 MG 24 hr tablet Take 0.5 tablets by mouth 2 (two) times daily.  06/28/16  Yes [provider]  rosuvastatin (CRESTOR) 10 MG tablet Take 10 mg by mouth every evening.  06/28/16  Yes [provider]  cetirizine (ZYRTEC) 10 MG tablet Take 10 mg by mouth daily as needed for allergies.    [provider]  traMADol (ULTRAM) 50 MG tablet Take 1 tablet (50 mg total) by mouth every 6 (six) hours as needed. 09/07/17 09/07/18  [name], MD    Family History Family History  Adopted: Yes  Family history unknown: Yes    Social History Social History   Tobacco Use  . Smoking status: Former Smoker    Packs/day: 1.00    Years: 40.00    Pack years: 40.00    Types: Cigarettes    Last attempt to quit: 09/06/2011    Years since quitting: 6.8  . Smokeless tobacco: Never Used  Substance Use Topics  . Alcohol use: No  . Drug use: No     Allergies   Patient has no known allergies.   Review of Systems Review of Systems  All other systems reviewed and are negative.    Physical Exam Updated Vital Signs BP (!) 165/75   Pulse 62   Temp 98.7 F (37.1 C) (Oral)   Resp 20   Ht 5\' 9"  (1.753 m)   Wt 79.4 kg   SpO2 92%   BMI 25.84 kg/m   Physical Exam  Constitutional: He is oriented to person, place, and time. He appears well-developed and well-nourished. No distress.  HENT:  Head: Normocephalic and atraumatic.  Eyes: Pupils are equal, round, and reactive to light. EOM and lids are normal. Lids are  everted and swept, no foreign bodies found. Right eye exhibits discharge and exudate. Right conjunctiva is injected.  Slit lamp exam:      The right eye shows no corneal abrasion, no corneal ulcer, no foreign body and no fluorescein uptake.  Cardiovascular: Normal rate.  Pulmonary/Chest: Effort normal.  Neurological: He is alert and oriented to person, place, and time.  Skin: Skin is warm and dry.  Psychiatric: He has a normal mood and affect. His behavior is normal.  Nursing note and vitals reviewed.    ED Treatments / Results  Labs (all labs ordered are  listed, but only abnormal results are displayed) Labs Reviewed - No data to display  EKG None  Radiology No results found.  Procedures Procedures (including critical care time)  Medications Ordered in ED Medications  trimethoprim-polymyxin b (POLYTRIM) ophthalmic solution 1 drop (has no administration in time range)  tetracaine (PONTOCAINE) 0.5 % ophthalmic solution 1 drop (1 drop Left Eye Given by Other 07/08/18 2000)  fluorescein ophthalmic strip 1 strip (1 strip Left Eye Given by Other 07/08/18 2000)     Initial Impression / Assessment and Plan / ED Course  I have reviewed the triage vital signs and the nursing notes.  Pertinent labs & imaging results that were available during my care of the patient were reviewed by me and considered in my medical decision making (see chart for details).    Patient with a history of hypertension, hyperlipidemia, coronary artery disease and COPD presenting today with 3 to 4 hours now of sensation of foreign body in his right eye with tearing and matting of his eyelashes.  Patient was inside watching TV when this happened he was not rubbing his eye and he did not note anything flying into his eye.  He has no evidence of lesions to the eyelid.  He does have eczema in his eyebrows and on his head but no evidence of zoster.  On exam patient has injection of his conjunctivitis, matting of his  eyelashes and tearing.  No foreign bodies are noted.  Lid was everted and swept with a Q-tip on the top and bottom.  No foreign bodies were seen.  There was no fluorescein uptake.  He has no pain in his eye at this time and states he no longer feels like there is any foreign body.  He does not use contact lenses.  He has no evidence of dendritic lesions.  He denies any change in vision.  Will treat for conjunctivitis as there are no corneal ulcers or abrasions.  Given follow-up with ophthalmology.   Final Clinical Impressions(s) / ED Diagnoses   Final diagnoses:  Acute conjunctivitis of right eye, unspecified acute conjunctivitis type    ED Discharge Orders    None       Blanchie Dessert, MD 07/08/18 2052

## 2018-07-08 NOTE — ED Triage Notes (Signed)
Pt reports fb sensation in right eye x 2 hours. He was watching TV when Sx started

## 2018-07-16 DIAGNOSIS — D23121 Other benign neoplasm of skin of left upper eyelid, including canthus: Secondary | ICD-10-CM | POA: Diagnosis not present

## 2018-07-16 DIAGNOSIS — H5712 Ocular pain, left eye: Secondary | ICD-10-CM | POA: Diagnosis not present

## 2018-07-26 ENCOUNTER — Encounter (HOSPITAL_BASED_OUTPATIENT_CLINIC_OR_DEPARTMENT_OTHER): Payer: Self-pay | Admitting: Emergency Medicine

## 2018-07-26 ENCOUNTER — Emergency Department (HOSPITAL_BASED_OUTPATIENT_CLINIC_OR_DEPARTMENT_OTHER): Payer: Medicare Other

## 2018-07-26 ENCOUNTER — Emergency Department (HOSPITAL_BASED_OUTPATIENT_CLINIC_OR_DEPARTMENT_OTHER)
Admission: EM | Admit: 2018-07-26 | Discharge: 2018-07-26 | Disposition: A | Discharge status: Home / Self Care | Payer: Medicare Other | Attending: Emergency Medicine | Admitting: Emergency Medicine

## 2018-07-26 ENCOUNTER — Other Ambulatory Visit: Payer: Self-pay

## 2018-07-26 DIAGNOSIS — J449 Chronic obstructive pulmonary disease, unspecified: Secondary | ICD-10-CM | POA: Diagnosis not present

## 2018-07-26 DIAGNOSIS — I10 Essential (primary) hypertension: Secondary | ICD-10-CM | POA: Diagnosis not present

## 2018-07-26 DIAGNOSIS — I2510 Atherosclerotic heart disease of native coronary artery without angina pectoris: Secondary | ICD-10-CM | POA: Diagnosis not present

## 2018-07-26 DIAGNOSIS — Z87891 Personal history of nicotine dependence: Secondary | ICD-10-CM | POA: Insufficient documentation

## 2018-07-26 DIAGNOSIS — Z79899 Other long term (current) drug therapy: Secondary | ICD-10-CM | POA: Diagnosis not present

## 2018-07-26 DIAGNOSIS — J069 Acute upper respiratory infection, unspecified: Secondary | ICD-10-CM | POA: Diagnosis not present

## 2018-07-26 DIAGNOSIS — N390 Urinary tract infection, site not specified: Secondary | ICD-10-CM | POA: Diagnosis not present

## 2018-07-26 DIAGNOSIS — Z7982 Long term (current) use of aspirin: Secondary | ICD-10-CM | POA: Diagnosis not present

## 2018-07-26 DIAGNOSIS — R0602 Shortness of breath: Secondary | ICD-10-CM | POA: Diagnosis not present

## 2018-07-26 DIAGNOSIS — R05 Cough: Secondary | ICD-10-CM | POA: Diagnosis not present

## 2018-07-26 MED ORDER — IPRATROPIUM-ALBUTEROL 0.5-2.5 (3) MG/3ML IN SOLN
RESPIRATORY_TRACT | Status: AC
  Administered 2018-07-26: 3 mL
  Filled 2018-07-26: qty 3

## 2018-07-26 MED ORDER — AZITHROMYCIN 250 MG PO TABS: 250.0000 mg | ORAL_TABLET | Freq: Every day | ORAL | 0 refills | Status: DC

## 2018-07-26 MED ORDER — ALBUTEROL SULFATE HFA 108 (90 BASE) MCG/ACT IN AERS
2.0000 | INHALATION_SPRAY | RESPIRATORY_TRACT | Status: DC | PRN
  Administered 2018-07-26: 2 via RESPIRATORY_TRACT
  Filled 2018-07-26: qty 6.7

## 2018-07-26 MED ORDER — ALBUTEROL SULFATE (2.5 MG/3ML) 0.083% IN NEBU
INHALATION_SOLUTION | RESPIRATORY_TRACT | Status: AC
  Administered 2018-07-26: 2.5 mg
  Filled 2018-07-26: qty 3

## 2018-07-26 NOTE — ED Provider Notes (Signed)
[facility] EMERGENCY DEPARTMENT Provider Note   CSN: [id number] Arrival date & time: 07/26/18  3810     History   Chief Complaint Chief Complaint  Patient presents with  . Cough    HPI Jordan Chambers is a 77 y.o. male.  HPI Patient presents with cough and shortness of breath.  Began last night.  Has had some mild green sputum production.  No fevers.  He does not smoke but states he had smoked for 40 years.  No known diagnosis of COPD.  No chest pain.  Does feel little fatigue. Past Medical History:  Diagnosis Date  . Bladder tumor   . CAD (coronary artery disease) cardiologist-  dr croitoru    08/ 2000  Cardiac cath w/ stenting x3 (per pt. one vessel);  12/ 2008 cardiac cath w/ angioplasty for in-stent restenosis  . Congestion of throat 09-05-2017  received call back from pt today 1349 , pt had seen his pcp , was told just his sinuses   09-05-2017 per pt congestion in throat , cough, occasion productive yellow, no fever (advised pt to see his pcp prior to surgery 09-07-2017)  . COPD (chronic obstructive pulmonary disease) ([place])   . Diverticulosis of colon   . Full dentures   . Gross hematuria   . History of adenomatous polyp of colon    2007; 2012; 2015  . History of basal cell carcinoma (BCC) excision    05/ 2014  nose  . History of exercise stress test 09-08-2016   dr croitoru   no evidence of stress induced ischmeia;  per dr croitoru note did not show evidence of any new blockages, blood pressure did increase excessively w/ exercise (not unexpected since we stopped metoprolol for the test)  . Hyperlipidemia   . Hypertension   . Peripheral arterial occlusive disease [facility])    09/ 2009  dx right SFA occlusion by dx dr Scot Dock  . Pulmonary nodule    RLL in 2001  . S/P coronary artery stent placement 03/1999   one vessel PCI and stents x3  . 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 Wears glasses     Patient Active Problem List   Diagnosis Date Noted  . PAD  (peripheral artery disease) ([place]) 09/02/2016  . HTN (hypertension) 08/24/2016  . COPD (chronic obstructive pulmonary disease) ([place]) 08/24/2016  . Colon polyps 08/24/2016  . Hypercholesteremia 08/24/2016  . Gout 08/24/2016  . Rocky Mountain spotted fever 08/24/2016  . Basal cell adenoma 08/24/2016  . Degenerative joint disease (DJD) of hip 03/03/2014    Past Surgical History:  Procedure Laterality Date  . APPENDECTOMY  1971  . CARDIOVASCULAR STRESS TEST  07-08-2010  dr croitoru   normal perfusion nuclear study w/ no evicende ischemia/  normal LV function and wall motion , ef 70%  . COLONOSCOPY  last one 04-02-2014  . CORONARY ANGIOPLASTY  12/ 2000   dr tysinger   angioplasty for in-stent restenosis  . CORONARY ANGIOPLASTY WITH STENT PLACEMENT  08/ 2000  dr tysinger   stenting x3  (per pt one vessel)  . CYSTOSCOPY W/ RETROGRADES Bilateral 09/07/2017   Procedure: CYSTOSCOPY WITH BILATERAL RETROGRADE PYELOGRAM;  Surgeon: [name], MD;  Location: [facility];  Service: Urology;  Laterality: Bilateral;  . CYSTOSCOPY WITH URETHRAL DILATATION N/A 09/07/2017   Procedure: CYSTOSCOPY WITH URETHRAL DILATATION;  Surgeon: [name], MD;  Location: [facility];  Service: Urology;  Laterality: N/A;  . TONSILLECTOMY  1951  .  TRANSURETHRAL RESECTION OF BLADDER TUMOR N/A 09/07/2017   Procedure: TRANSURETHRAL RESECTION OF BLADDER TUMOR  EPIRUBICIN(TURBT);  Surgeon: [name], MD;  Location: [facility];  Service: Urology;  Laterality: N/A;  . [place] Medications    Prior to Admission medications   Medication Sig Start Date End Date Taking? Authorizing Provider  amLODipine (NORVASC) 10 MG tablet Take 10 mg by mouth every morning.  06/28/16   [provider]  aspirin EC 81 MG tablet Take 81 mg by mouth daily.    [provider]  azithromycin (ZITHROMAX) 250 MG tablet Take 1 tablet (250 mg  total) by mouth daily. Take first 2 tablets together, then 1 every day until finished. 07/26/18   Davonna Belling, MD  cetirizine (ZYRTEC) 10 MG tablet Take 10 mg by mouth daily as needed for allergies.    [provider]  irbesartan (AVAPRO) 150 MG tablet Take 150 mg by mouth every morning.  06/28/16   [provider]  metoprolol succinate (TOPROL-XL) 100 MG 24 hr tablet Take 0.5 tablets by mouth 2 (two) times daily.  06/28/16   [provider]  rosuvastatin (CRESTOR) 10 MG tablet Take 10 mg by mouth every evening.  06/28/16   [provider]  traMADol (ULTRAM) 50 MG tablet Take 1 tablet (50 mg total) by mouth every 6 (six) hours as needed. 09/07/17 09/07/18  [name], MD    Family History Family History  Adopted: Yes  Family history unknown: Yes    Social History Social History   Tobacco Use  . Smoking status: Former Smoker    Packs/day: 1.00    Years: 40.00    Pack years: 40.00    Types: Cigarettes    Last attempt to quit: 09/06/2011    Years since quitting: 6.8  . Smokeless tobacco: Never Used  Substance Use Topics  . Alcohol use: No  . Drug use: No     Allergies   Patient has no known allergies.   Review of Systems Review of Systems  Constitutional: Negative for appetite change and fever.  HENT: Negative for congestion.   Respiratory: Positive for cough, shortness of breath and wheezing.   Cardiovascular: Negative for chest pain.  Gastrointestinal: Negative for abdominal pain.  Genitourinary: Negative for flank pain.  Musculoskeletal: Negative for back pain.  Skin: Negative for rash.  Neurological: Negative for syncope and weakness.  Psychiatric/Behavioral: Negative for confusion.     Physical Exam Updated Vital Signs BP 120/64 (BP Location: Right Arm)   Pulse 68   Temp 98.8 F (37.1 C) (Oral)   Resp 18   Ht 5\' 9"  (1.753 m)   Wt 79.4 kg   SpO2 92%   BMI 25.84 kg/m   Physical Exam  Constitutional: He appears  well-developed.  HENT:  Head: Normocephalic.  Eyes: EOM are normal.  Cardiovascular: Normal rate.  Pulmonary/Chest: No respiratory distress.  Wheezes bilaterally, worse on right side.  Abdominal: There is no tenderness.  Musculoskeletal: He exhibits no edema or tenderness.  Neurological: He is alert.  Skin: Skin is warm. Capillary refill takes less than 2 seconds.     ED Treatments / Results  Labs (all labs ordered are listed, but only abnormal results are displayed) Labs Reviewed - No data to display  EKG None  Radiology Dg Chest 2 View  Result Date: 07/26/2018 CLINICAL DATA:  Shortness of breath and productive cough. EXAM: CHEST -  2 VIEW COMPARISON:  05/02/2010 FINDINGS: The heart size and mediastinal contours are within normal limits. Stable right lung nodule previously demonstrated by CT to lie within the superior segment of the right lower lobe. There is no evidence of pulmonary edema, consolidation, pneumothorax, nodule or pleural fluid. The visualized skeletal structures are unremarkable. IMPRESSION: No active cardiopulmonary disease.  Stable right lower lobe nodule. Electronically Signed   By: Aletta Edouard M.D.   On: 07/26/2018 10:14    Procedures Procedures (including critical care time)  Medications Ordered in ED Medications  albuterol (PROVENTIL HFA;VENTOLIN HFA) 108 (90 Base) MCG/ACT inhaler 2 puff (2 puffs Inhalation Given 07/26/18 1225)  albuterol (PROVENTIL) (2.5 MG/3ML) 0.083% nebulizer solution (2.5 mg  Given 07/26/18 0935)  ipratropium-albuterol (DUONEB) 0.5-2.5 (3) MG/3ML nebulizer solution (3 mLs  Given 07/26/18 0935)     Initial Impression / Assessment and Plan / ED Course  I have reviewed the triage vital signs and the nursing notes.  Pertinent labs & imaging results that were available during my care of the patient were reviewed by me and considered in my medical decision making (see chart for details).     Patient with shortness of breath and  cough.  Some wheezing.  Improved after breathing treatment.  Initial sats were 87%.  Recent visit to the hospital however did have sats were read around 90%.  X-ray does not show pneumonia but with localizing lung findings to the right and possible underlying COPD history with his 40 years of smoking history we will treat with antibiotics.  Discharge home with outpatient follow-up.  Final Clinical Impressions(s) / ED Diagnoses   Final diagnoses:  Upper respiratory tract infection, unspecified type    ED Discharge Orders         Ordered    azithromycin (ZITHROMAX) 250 MG tablet  Daily     07/26/18 1220           Davonna Belling, MD 07/26/18 1246

## 2018-07-26 NOTE — ED Triage Notes (Signed)
Reports productive cough since last night.

## 2018-09-15 ENCOUNTER — Other Ambulatory Visit: Payer: Self-pay

## 2018-09-15 ENCOUNTER — Observation Stay (HOSPITAL_COMMUNITY)
Admission: EM | Admit: 2018-09-15 | Discharge: 2018-09-16 | Disposition: A | Discharge status: Home / Self Care | Payer: Medicare Other | Attending: Family Medicine | Admitting: Family Medicine

## 2018-09-15 ENCOUNTER — Encounter (HOSPITAL_COMMUNITY): Payer: Self-pay | Admitting: Emergency Medicine

## 2018-09-15 ENCOUNTER — Emergency Department (HOSPITAL_COMMUNITY): Payer: Medicare Other

## 2018-09-15 DIAGNOSIS — R079 Chest pain, unspecified: Secondary | ICD-10-CM | POA: Diagnosis not present

## 2018-09-15 DIAGNOSIS — Z79899 Other long term (current) drug therapy: Secondary | ICD-10-CM | POA: Insufficient documentation

## 2018-09-15 DIAGNOSIS — I200 Unstable angina: Secondary | ICD-10-CM

## 2018-09-15 DIAGNOSIS — I2510 Atherosclerotic heart disease of native coronary artery without angina pectoris: Secondary | ICD-10-CM | POA: Diagnosis not present

## 2018-09-15 DIAGNOSIS — Z7982 Long term (current) use of aspirin: Secondary | ICD-10-CM | POA: Diagnosis not present

## 2018-09-15 DIAGNOSIS — R0602 Shortness of breath: Secondary | ICD-10-CM | POA: Diagnosis not present

## 2018-09-15 DIAGNOSIS — I10 Essential (primary) hypertension: Secondary | ICD-10-CM | POA: Diagnosis not present

## 2018-09-15 DIAGNOSIS — M542 Cervicalgia: Secondary | ICD-10-CM | POA: Diagnosis not present

## 2018-09-15 DIAGNOSIS — R0789 Other chest pain: Secondary | ICD-10-CM | POA: Diagnosis present

## 2018-09-15 DIAGNOSIS — Z955 Presence of coronary angioplasty implant and graft: Secondary | ICD-10-CM | POA: Insufficient documentation

## 2018-09-15 DIAGNOSIS — Z87891 Personal history of nicotine dependence: Secondary | ICD-10-CM | POA: Insufficient documentation

## 2018-09-15 DIAGNOSIS — J449 Chronic obstructive pulmonary disease, unspecified: Secondary | ICD-10-CM | POA: Diagnosis not present

## 2018-09-15 DIAGNOSIS — I25119 Atherosclerotic heart disease of native coronary artery with unspecified angina pectoris: Secondary | ICD-10-CM | POA: Diagnosis not present

## 2018-09-15 LAB — CBC
HCT: 46.2 % (ref 39.0–52.0)
HEMOGLOBIN: 15 g/dL (ref 13.0–17.0)
MCH: 27.9 pg (ref 26.0–34.0)
MCHC: 32.5 g/dL (ref 30.0–36.0)
MCV: 85.9 fL (ref 80.0–100.0)
NRBC: 0 % (ref 0.0–0.2)
Platelets: 240 10*3/uL (ref 150–400)
RBC: 5.38 MIL/uL (ref 4.22–5.81)
RDW: 12.8 % (ref 11.5–15.5)
WBC: 10.3 10*3/uL (ref 4.0–10.5)

## 2018-09-15 LAB — APTT: APTT: 29 s (ref 24–36)

## 2018-09-15 LAB — HEMOGLOBIN A1C
Hgb A1c MFr Bld: 6 % — ABNORMAL HIGH (ref 4.8–5.6)
MEAN PLASMA GLUCOSE: 125.5 mg/dL

## 2018-09-15 LAB — BASIC METABOLIC PANEL
ANION GAP: 12 (ref 5–15)
BUN: 22 mg/dL (ref 8–23)
CALCIUM: 9.4 mg/dL (ref 8.9–10.3)
CO2: 27 mmol/L (ref 22–32)
Chloride: 101 mmol/L (ref 98–111)
Creatinine, Ser: 0.85 mg/dL (ref 0.61–1.24)
GFR calc non Af Amer: >60 mL/min (ref >60)
GLUCOSE: 174 mg/dL — AB (ref 70–99)
POTASSIUM: 3.6 mmol/L (ref 3.5–5.1)
Sodium: 140 mmol/L (ref 135–145)

## 2018-09-15 LAB — I-STAT TROPONIN, ED: TROPONIN I, POC: 0 ng/mL (ref 0.00–0.08)

## 2018-09-15 LAB — PROTIME-INR
INR: 1.01
PROTHROMBIN TIME: 13.2 s (ref 11.4–15.2)

## 2018-09-15 LAB — TROPONIN I: Troponin I: <0.03 ng/mL (ref <0.03)

## 2018-09-15 MED ORDER — NITROGLYCERIN 0.4 MG SL SUBL: 0.4000 mg | SUBLINGUAL_TABLET | SUBLINGUAL | Status: DC | PRN

## 2018-09-15 MED ORDER — HEPARIN BOLUS VIA INFUSION
4000.0000 [IU] | Freq: Once | INTRAVENOUS | Status: AC
Start: 2018-09-15 — End: 2018-09-15
  Administered 2018-09-15: 4000 [IU] via INTRAVENOUS
  Filled 2018-09-15: qty 4000

## 2018-09-15 MED ORDER — NITROGLYCERIN 2 % TD OINT
1.0000 [in_us] | TOPICAL_OINTMENT | Freq: Once | TRANSDERMAL | Status: AC
  Administered 2018-09-15: 1 [in_us] via TOPICAL
  Filled 2018-09-15: qty 1

## 2018-09-15 MED ORDER — HEPARIN (PORCINE) 25000 UT/250ML-% IV SOLN
1200.0000 [IU]/h | INTRAVENOUS | Status: DC
  Administered 2018-09-15 – 2018-09-16 (×2): 1200 [IU]/h via INTRAVENOUS
  Filled 2018-09-15 (×2): qty 250

## 2018-09-15 MED ORDER — ASPIRIN 81 MG PO CHEW
324.0000 mg | CHEWABLE_TABLET | Freq: Once | ORAL | Status: AC
  Administered 2018-09-15: 324 mg via ORAL
  Filled 2018-09-15: qty 4

## 2018-09-15 MED ORDER — ACETAMINOPHEN 325 MG PO TABS
650.0000 mg | ORAL_TABLET | ORAL | Status: DC | PRN
  Administered 2018-09-16: 650 mg via ORAL
  Filled 2018-09-15: qty 2

## 2018-09-15 MED ORDER — AMLODIPINE BESYLATE 5 MG PO TABS
10.0000 mg | ORAL_TABLET | Freq: Every day | ORAL | Status: DC
  Administered 2018-09-16: 10 mg via ORAL
  Filled 2018-09-15: qty 2

## 2018-09-15 MED ORDER — NITROGLYCERIN 0.4 MG SL SUBL
0.4000 mg | SUBLINGUAL_TABLET | SUBLINGUAL | Status: DC | PRN
  Administered 2018-09-15: 0.4 mg via SUBLINGUAL
  Filled 2018-09-15: qty 1

## 2018-09-15 MED ORDER — IRBESARTAN 150 MG PO TABS
150.0000 mg | ORAL_TABLET | Freq: Every morning | ORAL | Status: DC
  Administered 2018-09-16: 150 mg via ORAL
  Filled 2018-09-15: qty 1

## 2018-09-15 MED ORDER — ONDANSETRON HCL 4 MG/2ML IJ SOLN: 4.0000 mg | Freq: Four times a day (QID) | INTRAMUSCULAR | Status: DC | PRN

## 2018-09-15 MED ORDER — METOPROLOL SUCCINATE ER 25 MG PO TB24
50.0000 mg | ORAL_TABLET | Freq: Two times a day (BID) | ORAL | Status: DC
  Administered 2018-09-15 – 2018-09-16 (×2): 50 mg via ORAL
  Filled 2018-09-15 (×2): qty 2

## 2018-09-15 MED ORDER — ROSUVASTATIN CALCIUM 5 MG PO TABS
10.0000 mg | ORAL_TABLET | Freq: Every day | ORAL | Status: DC
  Administered 2018-09-15: 10 mg via ORAL

## 2018-09-15 MED ORDER — MORPHINE SULFATE (PF) 2 MG/ML IV SOLN: 1.0000 mg | INTRAVENOUS | Status: DC | PRN

## 2018-09-15 MED ORDER — ASPIRIN EC 81 MG PO TBEC
81.0000 mg | DELAYED_RELEASE_TABLET | Freq: Every day | ORAL | Status: DC
  Administered 2018-09-16: 81 mg via ORAL
  Filled 2018-09-15: qty 1

## 2018-09-15 NOTE — ED Provider Notes (Signed)
[facility] EMERGENCY DEPARTMENT Provider Note   CSN: [id number] Arrival date & time: 09/15/18  1720     History   Chief Complaint Chief Complaint  Patient presents with  . Arm Pain    HPI Jordan Chambers is a 78 y.o. male.  Who presents the emergency department with chief complaint of back and left arm pain.  He has a past medical history of coronary artery disease status post stenting x3 in 2000 and 2008.  Patient states that he has had about 1 month of worsening exertional dyspnea which is relieved with rest.  He denies chest pain.  Patient states that today he developed pain in his upper back and left arm.  Patient does state that his back pain is worse with movement of his neck into extension however he is unable to reproduce the left arm pain.  Nothing he seems to do makes the arm pain worse or better.  He denies shortness of breath, diaphoresis, nausea or vomiting.  He had onset of the pain around 9 AM this morning.  HPI  Past Medical History:  Diagnosis Date  . Bladder tumor   . CAD (coronary artery disease) cardiologist-  dr croitoru    08/ 2000  Cardiac cath w/ stenting x3 (per pt. one vessel);  12/ 2008 cardiac cath w/ angioplasty for in-stent restenosis  . Congestion of throat 09-05-2017  received call back from pt today 1349 , pt had seen his pcp , was told just his sinuses   09-05-2017 per pt congestion in throat , cough, occasion productive yellow, no fever (advised pt to see his pcp prior to surgery 09-07-2017)  . COPD (chronic obstructive pulmonary disease) ([place])   . Diverticulosis of colon   . Full dentures   . Gross hematuria   . History of adenomatous polyp of colon    2007; 2012; 2015  . History of basal cell carcinoma (BCC) excision    05/ 2014  nose  . History of exercise stress test 09-08-2016   dr croitoru   no evidence of stress induced ischmeia;  per dr croitoru note did not show evidence of any new blockages, blood pressure did increase  excessively w/ exercise (not unexpected since we stopped metoprolol for the test)  . Hyperlipidemia   . Hypertension   . Peripheral arterial occlusive disease [facility])    09/ 2009  dx right SFA occlusion by dx dr Scot Dock  . Pulmonary nodule    RLL in 2001  . S/P coronary artery stent placement 03/1999   one vessel PCI and stents x3  . 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 Wears glasses     Patient Active Problem List   Diagnosis Date Noted  . Unstable angina ([place]) 09/15/2018  . CAD (coronary artery disease) 09/15/2018  . PAD (peripheral artery disease) ([place]) 09/02/2016  . HTN (hypertension) 08/24/2016  . COPD (chronic obstructive pulmonary disease) ([place]) 08/24/2016  . Colon polyps 08/24/2016  . Hypercholesteremia 08/24/2016  . Gout 08/24/2016  . [place] spotted fever 08/24/2016  . Basal cell adenoma 08/24/2016  . Degenerative joint disease (DJD) of hip 03/03/2014    Past Surgical History:  Procedure Laterality Date  . APPENDECTOMY  1971  . CARDIOVASCULAR STRESS TEST  07-08-2010  dr croitoru   normal perfusion nuclear study w/ no evicende ischemia/  normal LV function and wall motion , ef 70%  . COLONOSCOPY  last one 04-02-2014  . CORONARY ANGIOPLASTY  12/ 2000  dr tysinger   angioplasty for in-stent restenosis  . CORONARY ANGIOPLASTY WITH STENT PLACEMENT  08/ 2000  dr tysinger   stenting x3  (per pt one vessel)  . CYSTOSCOPY W/ RETROGRADES Bilateral 09/07/2017   Procedure: CYSTOSCOPY WITH BILATERAL RETROGRADE PYELOGRAM;  Surgeon: [name], MD;  Location: [facility];  Service: Urology;  Laterality: Bilateral;  . CYSTOSCOPY WITH URETHRAL DILATATION N/A 09/07/2017   Procedure: CYSTOSCOPY WITH URETHRAL DILATATION;  Surgeon: [name], MD;  Location: [facility];  Service: Urology;  Laterality: N/A;  . TONSILLECTOMY  1951  . TRANSURETHRAL RESECTION OF BLADDER TUMOR N/A 09/07/2017   Procedure: TRANSURETHRAL RESECTION OF BLADDER TUMOR   EPIRUBICIN(TURBT);  Surgeon: [name], MD;  Location: [facility];  Service: Urology;  Laterality: N/A;  . [place] Medications    Prior to Admission medications   Medication Sig Start Date End Date Taking? Authorizing Provider  amLODipine (NORVASC) 10 MG tablet Take 10 mg by mouth daily.  06/28/16  Yes [provider]  aspirin EC 81 MG tablet Take 81 mg by mouth daily.   Yes [provider]  ibuprofen (ADVIL,MOTRIN) 200 MG tablet Take 400 mg by mouth every 6 (six) hours as needed for headache or mild pain.   Yes [provider]  irbesartan (AVAPRO) 150 MG tablet Take 150 mg by mouth every morning.  06/28/16  Yes [provider]  metoprolol succinate (TOPROL-XL) 100 MG 24 hr tablet Take 0.5 tablets by mouth 2 (two) times daily.  06/28/16  Yes [provider]  rosuvastatin (CRESTOR) 10 MG tablet Take 10 mg by mouth every evening.  06/28/16  Yes [provider]    Family History Family History  Adopted: Yes  Family history unknown: Yes    Social History Social History   Tobacco Use  . Smoking status: Former Smoker    Packs/day: 1.00    Years: 40.00    Pack years: 40.00    Types: Cigarettes    Last attempt to quit: 09/06/2011    Years since quitting: 7.0  . Smokeless tobacco: Never Used  Substance Use Topics  . Alcohol use: No  . Drug use: No     Allergies   Patient has no known allergies.   Review of Systems Review of Systems  Ten systems reviewed and are negative for acute change, except as noted in the HPI.   Physical Exam Updated Vital Signs BP (!) 166/69 (BP Location: Left Arm)   Pulse 64   Temp 97.6 F (36.4 C) (Oral)   Resp 20   Ht 5\' 9"  (1.753 m)   Wt 83.8 kg   SpO2 96%   BMI 27.28 kg/m   Physical Exam Vitals signs and nursing note reviewed.  Constitutional:      General: He is not in acute distress.    Appearance: Normal appearance.  He is well-developed. He is not diaphoretic.  HENT:     Head: Normocephalic and atraumatic.  Eyes:     General: No scleral icterus.    Conjunctiva/sclera: Conjunctivae normal.  Neck:     Musculoskeletal: Normal range of motion and neck supple. No muscular tenderness.  Cardiovascular:     Rate and Rhythm: Normal rate and regular rhythm.     Heart sounds: Normal heart sounds.  Pulmonary:     Effort: Pulmonary effort is normal. No respiratory distress.  Breath sounds: Normal breath sounds.  Abdominal:     Palpations: Abdomen is soft.     Tenderness: There is no abdominal tenderness.  Musculoskeletal:     Comments: No reproducible tenderness of the neck shoulders or shoulder blade region.  Unable to reproduce pain.  No tenderness of the left upper extremity or shoulders.  Skin:    General: Skin is warm and dry.  Neurological:     Mental Status: He is alert.  Psychiatric:        Behavior: Behavior normal.      ED Treatments / Results  Labs (all labs ordered are listed, but only abnormal results are displayed) Labs Reviewed  BASIC METABOLIC PANEL - Abnormal; Notable for the following components:      Result Value   Glucose, Bld 174 (*)    All other components within normal limits  HEMOGLOBIN A1C - Abnormal; Notable for the following components:   Hgb A1c MFr Bld 6.0 (*)    All other components within normal limits  CBC  APTT  PROTIME-INR  TROPONIN I  HEPARIN LEVEL (UNFRACTIONATED)  CBC  TROPONIN I  TROPONIN I  BASIC METABOLIC PANEL  LIPID PANEL  I-STAT TROPONIN, ED    EKG EKG Interpretation  Date/Time:  Saturday September 15 2018 17:29:53 EST Ventricular Rate:  74 PR Interval:    QRS Duration: 99 QT Interval:  387 QTC Calculation: 430 R Axis:   100 Text Interpretation:  Sinus rhythm Right axis deviation slight increase in ST depression Confirmed by Malvin Johns [phone]) on 09/15/2018 6:07:30 PM   Radiology Dg Chest 2 View  Result Date:  09/15/2018 CLINICAL DATA:  Shortness of breath EXAM: CHEST - 2 VIEW COMPARISON:  July 26, 2018, Jan 21, 2016 FINDINGS: The heart size and mediastinal contours are within normal limits. There is a nodule in the right upper lobe unchanged compared to prior chest x-ray of 2017. There is no focal infiltrate, pulmonary edema, or pleural effusion. The visualized skeletal structures are stable. IMPRESSION: No active cardiopulmonary disease. Stable nodule in the right upper lobe unchanged compared to prior exam of 2017. This is benign as more than 2 years stability is established. Electronically Signed   By: Abelardo Diesel M.D.   On: 09/15/2018 18:40    Procedures .Critical Care Performed by: Margarita Mail, PA-C Authorized by: Margarita Mail, PA-C   Critical care provider statement:    Critical care time (minutes):  45   Critical care was necessary to treat or prevent imminent or life-threatening deterioration of the following conditions:  Cardiac failure   Critical care was time spent personally by me on the following activities:  Discussions with consultants, evaluation of patient's response to treatment, examination of patient, ordering and performing treatments and interventions, ordering and review of laboratory studies, ordering and review of radiographic studies, pulse oximetry, re-evaluation of patient's condition, obtaining history from patient or surrogate and review of old charts   (including critical care time)  Medications Ordered in ED Medications  heparin ADULT infusion 100 units/mL (25000 units/227mL sodium chloride 0.45%) (1,200 Units/hr Intravenous New Bag/Given 09/15/18 2031)  aspirin EC tablet 81 mg (has no administration in time range)  amLODipine (NORVASC) tablet 10 mg (has no administration in time range)  irbesartan (AVAPRO) tablet 150 mg (has no administration in time range)  metoprolol succinate (TOPROL-XL) 24 hr tablet 50 mg (50 mg Oral Given 09/15/18 2030)  rosuvastatin  (CRESTOR) tablet 10 mg (10 mg Oral Given 09/15/18 2036)  nitroGLYCERIN (NITROSTAT) SL  tablet 0.4 mg (has no administration in time range)  acetaminophen (TYLENOL) tablet 650 mg (has no administration in time range)  ondansetron (ZOFRAN) injection 4 mg (has no administration in time range)  morphine 2 MG/ML injection 1-3 mg (has no administration in time range)  aspirin chewable tablet 324 mg (324 mg Oral Given 09/15/18 1807)  nitroGLYCERIN (NITROGLYN) 2 % ointment 1 inch (1 inch Topical Given 09/15/18 1834)  heparin bolus via infusion 4,000 Units (4,000 Units Intravenous Bolus from Bag 09/15/18 2032)     Initial Impression / Assessment and Plan / ED Course  I have reviewed the triage vital signs and the nursing notes.  Pertinent labs & imaging results that were available during my care of the patient were reviewed by me and considered in my medical decision making (see chart for details).  Clinical Course as of Sep 16 14  Sat Sep 15, 2018  1812 HCT: 21.2 [AH]  3255 78 year old male with history of coronary artery disease and stenting.  His been almost 11 years since his previous catheterization.  Patient has new ST segment depressions in the lateral leads concerning for acute coronary syndrome.  His initial troponin is negative.  Patient was given 1 dose of sublingual nitroglycerin which improved the left arm pain.  He was given full dose aspirin.  Of concern for acute coronary syndrome/unstable angina and have begun the patient on heparin.  I have placed consult to cardiology.   [AH]    Clinical Course User Index [AH] Margarita Mail, PA-C    Patient will be admitted to the hospitalist service for presumed ACS/and STEMI.  Cardiology to consult.  I discussed the findings with the patient and did my best to explain them to him.  Patient's pain is greatly improved with nitroglycerin.  He is stable throughout his ER visit.  Final Clinical Impressions(s) / ED Diagnoses   Final diagnoses:   Unstable angina [facility])    ED Discharge Orders    None       Margarita Mail, PA-C 09/16/18 0016    Malvin Johns, MD 09/16/18 1513

## 2018-09-15 NOTE — Consult Note (Addendum)
Cardiology Consultation:   Patient ID: Jordan Chambers MRN: [id number]; DOB: [birth year]  Admit date: 09/15/2018 Date of Consult: 09/15/2018  Primary Care Provider: Deland Pretty, MD Primary Cardiologist: Dr. Sallyanne Kuster  Patient Profile:   Jordan Chambers is a 78 y.o. male with a hx of CAD with prior PCI, PAD, HTN, HLD, COPD, who is being seen today for the evaluation of shoulder pain and dyspnea at the request of Margarita Mail.  History of Present Illness:   Jordan Chambers is a 78 y.o. male with a hx of CAD with prior PCI, PAD, HTN, HLD, COPD, who presents to the ED with shoulder pain and dyspnea.   The patient has a history of CAD with multiple prior PCI in 2000 and repeat PCI for stent restenosis in 2008. He follows in clinic with Dr. Sallyanne Kuster and was last seen in January 2018, at which time he had no cardiac symptoms. He underwent ETT at that time which was normal.   He presented to the ED this evening with pain in his back and shoulder. In the ED, BP initially 189/69 with HR 70s. ECG showed NSR with ST depressions in inferolateral leads. Initial troponin negative. He was given NTG with improvement of his pain. He was started on heparin gtt and admitted to hospialist service. Cardiology was consulted.   On my evaluation, the patient is resting comfortably in bed. His is a challenging historian but states that he has experienced about 2 months of dyspnea with significant exertion. He has not had any exertional chest pain. He states that today he experienced pain in is back with radiation into his shoulder. He denies chest pain or other symptoms. He reports that his pain improved with NTG. He continues to be able to reproduce pain in his back with extension of head at his neck.    Past Medical History:  Diagnosis Date  . Bladder tumor   . CAD (coronary artery disease) cardiologist-  dr croitoru    08/ 2000  Cardiac cath w/ stenting x3 (per pt. one vessel);  12/ 2008 cardiac cath w/ angioplasty for  in-stent restenosis  . Congestion of throat 09-05-2017  received call back from pt today 1349 , pt had seen his pcp , was told just his sinuses   09-05-2017 per pt congestion in throat , cough, occasion productive yellow, no fever (advised pt to see his pcp prior to surgery 09-07-2017)  . COPD (chronic obstructive pulmonary disease) ([place])   . Diverticulosis of colon   . Full dentures   . Gross hematuria   . History of adenomatous polyp of colon    2007; 2012; 2015  . History of basal cell carcinoma (BCC) excision    05/ 2014  nose  . History of exercise stress test 09-08-2016   dr croitoru   no evidence of stress induced ischmeia;  per dr croitoru note did not show evidence of any new blockages, blood pressure did increase excessively w/ exercise (not unexpected since we stopped metoprolol for the test)  . Hyperlipidemia   . Hypertension   . Peripheral arterial occlusive disease [facility])    09/ 2009  dx right SFA occlusion by dx dr Scot Dock  . Pulmonary nodule    RLL in 2001  . S/P coronary artery stent placement 03/1999   one vessel PCI and stents x3  . 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 Wears glasses     Past Surgical History:  Procedure Laterality Date  . APPENDECTOMY  1971  . CARDIOVASCULAR STRESS TEST  07-08-2010  dr croitoru   normal perfusion nuclear study w/ no evicende ischemia/  normal LV function and wall motion , ef 70%  . COLONOSCOPY  last one 04-02-2014  . CORONARY ANGIOPLASTY  12/ 2000   dr tysinger   angioplasty for in-stent restenosis  . CORONARY ANGIOPLASTY WITH STENT PLACEMENT  08/ 2000  dr tysinger   stenting x3  (per pt one vessel)  . CYSTOSCOPY W/ RETROGRADES Bilateral 09/07/2017   Procedure: CYSTOSCOPY WITH BILATERAL RETROGRADE PYELOGRAM;  Surgeon: [name], MD;  Location: [facility];  Service: Urology;  Laterality: Bilateral;  . CYSTOSCOPY WITH URETHRAL DILATATION N/A 09/07/2017   Procedure: CYSTOSCOPY WITH URETHRAL DILATATION;  Surgeon:  [name], MD;  Location: [facility];  Service: Urology;  Laterality: N/A;  . TONSILLECTOMY  1951  . TRANSURETHRAL RESECTION OF BLADDER TUMOR N/A 09/07/2017   Procedure: TRANSURETHRAL RESECTION OF BLADDER TUMOR  EPIRUBICIN(TURBT);  Surgeon: [name], MD;  Location: [facility];  Service: Urology;  Laterality: N/A;  . [place] Medications:  Prior to Admission medications   Medication Sig Start Date End Date Taking? Authorizing Provider  amLODipine (NORVASC) 10 MG tablet Take 10 mg by mouth daily.  06/28/16  Yes [provider]  aspirin EC 81 MG tablet Take 81 mg by mouth daily.   Yes [provider]  ibuprofen (ADVIL,MOTRIN) 200 MG tablet Take 400 mg by mouth every 6 (six) hours as needed for headache or mild pain.   Yes [provider]  irbesartan (AVAPRO) 150 MG tablet Take 150 mg by mouth every morning.  06/28/16  Yes [provider]  metoprolol succinate (TOPROL-XL) 100 MG 24 hr tablet Take 0.5 tablets by mouth 2 (two) times daily.  06/28/16  Yes [provider]  rosuvastatin (CRESTOR) 10 MG tablet Take 10 mg by mouth every evening.  06/28/16  Yes [provider]    Inpatient Medications: Scheduled Meds: . [START ON 09/16/2018] amLODipine  10 mg Oral Daily  . [START ON 09/16/2018] aspirin EC  81 mg Oral Daily  . heparin  4,000 Units Intravenous Once  . [START ON 09/16/2018] irbesartan  150 mg Oral q morning - 10a  . metoprolol succinate  50 mg Oral BID  . rosuvastatin  10 mg Oral q1800   Continuous Infusions: . heparin     PRN Meds: acetaminophen, morphine injection, nitroGLYCERIN, ondansetron (ZOFRAN) IV  Allergies:   No Known Allergies  Social History:   Social History   Socioeconomic History  . Marital status: Married    Spouse name: Not on file  . Number of children: Not on file  . Years of education: Not on file  . Highest education level: Not  on file  Occupational History  . Not on file  Social Needs  . Financial resource strain: Not on file  . Food insecurity:    Worry: Not on file    Inability: Not on file  . Transportation needs:    Medical: Not on file    Non-medical: Not on file  Tobacco Use  . Smoking status: Former Smoker    Packs/day: 1.00    Years: 40.00    Pack years: 40.00    Types: Cigarettes    Last attempt to quit: 09/06/2011    Years since quitting: 7.0  . Smokeless tobacco: Never Used  Substance and Sexual Activity  .  Alcohol use: No  . Drug use: No  . Sexual activity: Not on file  Lifestyle  . Physical activity:    Days per week: Not on file    Minutes per session: Not on file  . Stress: Not on file  Relationships  . Social connections:    Talks on phone: Not on file    Gets together: Not on file    Attends religious service: Not on file    Active member of club or organization: Not on file    Attends meetings of clubs or organizations: Not on file    Relationship status: Not on file  . Intimate partner violence:    Fear of current or ex partner: Not on file    Emotionally abused: Not on file    Physically abused: Not on file    Forced sexual activity: Not on file  Other Topics Concern  . Not on file  Social History Narrative  . Not on file    Family History:    Family History  Adopted: Yes  Family history unknown: Yes     ROS:  Please see the history of present illness.  All other ROS reviewed and negative.     Physical Exam/Data:   Vitals:   09/15/18 1808 09/15/18 1833 09/15/18 1834 09/15/18 1930  BP: (!) 174/67 139/66  120/68  Pulse: 69 66  (!) 56  Resp:  (!) 26  (!) 21  Temp:      TempSrc:      SpO2: 95% 95%  94%  Weight:   79.4 kg   Height:       No intake or output data in the 24 hours ending 09/15/18 2007 Last 3 Weights 09/15/2018 07/26/2018 07/08/2018  Weight (lbs) 175 lb 0.7 oz 175 lb 175 lb  Weight (kg) 79.4 kg 79.379 kg 79.379 kg     Body mass index is  25.85 kg/m.  General:  Well nourished, well developed, in no acute distress  HEENT: normal Lymph: no adenopathy Neck: no JVD Cardiac:  normal S1, S2; RRR; no murmur   Lungs:  clear to auscultation bilaterally, no wheezing, rhonchi or rales  Abd: soft, nontender  Ext: no edema Musculoskeletal:  No deformities, BUE and BLE strength normal and equal Skin: warm and dry  Neuro:  No focal abnormalities noted Psych:  Normal affect   EKG:  The EKG was personally reviewed and demonstrates:  NSR with ST depressions in inferolateral leads Telemetry:  Telemetry was personally reviewed and demonstrates:  NSR with no events  Relevant CV Studies:  ETT 08/2016:  Blood pressure demonstrated a hypertensive response to exercise.  There was no ST segment deviation noted during stress.  No T wave inversion was noted during stress. Hypertensive response to exercise and reduced exercise capacity. No ECG evidence of stress induced ischemia.  SPECT 2011 No ischemia. EF 70%  Laboratory Data:  Chemistry Recent Labs  Lab 09/15/18 1747  NA 140  K 3.6  CL 101  CO2 27  GLUCOSE 174*  BUN 22  CREATININE 0.85  CALCIUM 9.4  GFRNONAA >60  GFRAA >60  ANIONGAP 12    No results for input(s): PROT, ALBUMIN, AST, ALT, ALKPHOS, BILITOT in the last 168 hours. Hematology Recent Labs  Lab 09/15/18 1747  WBC 10.3  RBC 5.38  HGB 15.0  HCT 46.2  MCV 85.9  MCH 27.9  MCHC 32.5  RDW 12.8  PLT 240   Cardiac EnzymesNo results for input(s): TROPONINI in the  last 168 hours.  Recent Labs  Lab 09/15/18 1801  TROPIPOC 0.00    BNPNo results for input(s): BNP, PROBNP in the last 168 hours.  DDimer No results for input(s): DDIMER in the last 168 hours.  Radiology/Studies:  Dg Chest 2 View  Result Date: 09/15/2018 CLINICAL DATA:  Shortness of breath EXAM: CHEST - 2 VIEW COMPARISON:  July 26, 2018, Jan 21, 2016 FINDINGS: The heart size and mediastinal contours are within normal limits. There is a  nodule in the right upper lobe unchanged compared to prior chest x-ray of 2017. There is no focal infiltrate, pulmonary edema, or pleural effusion. The visualized skeletal structures are stable. IMPRESSION: No active cardiopulmonary disease. Stable nodule in the right upper lobe unchanged compared to prior exam of 2017. This is benign as more than 2 years stability is established. Electronically Signed   By: Abelardo Diesel M.D.   On: 09/15/2018 18:40    Assessment and Plan:   Dyspnea CAD The patient has a history of CAD with multiple remote interventions. He presents to the ED today with multiple nonspecific symptoms including exertional dyspnea and back/shoulder pain. He denies chest pain for me. Troponin is initially negative, but ECG does show some notable ST depressions. He was started on heparin gtt due to concern for UA. It is unclear if his symptoms are cardiac in etiology, but given his history, continued monitoring and workup are appropriate. -Continue to trend troponin -Reasonable to continue heparin gtt for now, although this could potentially be stopped if troponin remains negative -Continue home ASA, statin -Echocardiogram ordered -HgA1c, lipid panel ordered -We will follow along to determine type and timing of ischemic evaluation  HTN BP elevated in the ED. This may be contributing to symptoms -Continue home metoprolol, amlodipine, ARB -May need further medication titration   HLD -Lipid panel ordered -Continue statin   PAD -Continue ASA, statin       For questions or updates, please contact [facility] Please consult [email or web address] for contact info under     Signed, Nila Nephew, MD  09/15/2018 8:07 PM

## 2018-09-15 NOTE — Progress Notes (Addendum)
ANTICOAGULATION CONSULT NOTE - Initial Consult  Pharmacy Consult for heparin Indication: chest pain/ACS  No Known Allergies  Patient Measurements:   Heparin Dosing Weight: 79kg  Vital Signs: Temp: 97.7 F (36.5 C) (01/18 1729) Temp Source: Oral (01/18 1729) BP: 174/67 (01/18 1807) Pulse Rate: 71 (01/18 1807)  Labs: Recent Labs    09/15/18 1747  HGB 15.0  HCT 46.2  PLT 240  CREATININE 0.85    CrCl cannot be calculated (Unknown ideal weight.).   Medical History: Past Medical History:  Diagnosis Date  . Bladder tumor   . CAD (coronary artery disease) cardiologist-  dr croitoru    08/ 2000  Cardiac cath w/ stenting x3 (per pt. one vessel);  12/ 2008 cardiac cath w/ angioplasty for in-stent restenosis  . Congestion of throat 09-05-2017  received call back from pt today 1349 , pt had seen his pcp , was told just his sinuses   09-05-2017 per pt congestion in throat , cough, occasion productive yellow, no fever (advised pt to see his pcp prior to surgery 09-07-2017)  . COPD (chronic obstructive pulmonary disease) ([place])   . Diverticulosis of colon   . Full dentures   . Gross hematuria   . History of adenomatous polyp of colon    2007; 2012; 2015  . History of basal cell carcinoma (BCC) excision    05/ 2014  nose  . History of exercise stress test 09-08-2016   dr croitoru   no evidence of stress induced ischmeia;  per dr croitoru note did not show evidence of any new blockages, blood pressure did increase excessively w/ exercise (not unexpected since we stopped metoprolol for the test)  . Hyperlipidemia   . Hypertension   . Peripheral arterial occlusive disease [facility])    09/ 2009  dx right SFA occlusion by dx dr Scot Dock  . Pulmonary nodule    RLL in 2001  . S/P coronary artery stent placement 03/1999   one vessel PCI and stents x3  . 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 Wears glasses     Medications:  (Not in a hospital admission)  Scheduled:   Infusions:     Assessment: Pt presented with arm pain. He has a hx of CAD with multiple stents. Heparin has been ordered to r/o MI. He is on no PTA anticoagulants.  Scr 0.85, hgb 15, plt 240.  Goal of Therapy:  Heparin level 0.3-0.7 units/ml Monitor platelets by anticoagulation protocol: Yes   Plan:   Heparin 4000 units x1 Heparin infusion 1200 units/hr F/u with heparin level in 6 hrs Daily HL and CBC  Onnie Boer, PharmD, BCIDP, AAHIVP, CPP Infectious Disease Pharmacist 09/15/2018 6:45 PM

## 2018-09-15 NOTE — H&P (Signed)
History and Physical    Jordan Chambers:[id number] DOB: [birth year] DOA: 09/15/2018  PCP: Deland Pretty, MD   Patient coming from: Home   Chief Complaint: Progressive DOE, left shoulder and arm ache   HPI: Jordan Chambers is a 78 y.o. male with medical history significant for hypertension and coronary artery disease with stents, now presenting to the emergency department for evaluation of exertional dyspnea and left arm and upper back pain.  Patient presents with recent development of dyspnea with minimal exertion, most notable for the past 2 days.  Today, he developed pain in the upper back, at the base of his neck, worse with leaning his head back, mild, and constant.  He also has developed an ache in his left shoulder and arm today that was also constant, but unchanged by movement, and resolved with nitroglycerin in the ED.  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  Denies cough, fevers, chills, leg swelling or tenderness, abdominal pain, nausea, vomiting, or diarrhea.  Reports that he never had any chest pain or shortness of breath prior to his stents or angioplasty, but states that he was found to have CAD on some routine testing that was treated.  ED Course: Upon arrival to the ED, patient is found to be afebrile, saturating well on room air, and with vitals otherwise normal.  EKG features a sinus rhythm with RAD and ST depressions in leads V3 through V6.  Chest x-ray is negative for acute cardiopulmonary disease.  Chemistry panel is notable for glucose of 174 and CBC is unremarkable.  INR is normal and troponin is 0.00.  Patient was given 324 mg of aspirin, nitroglycerin ointment, and started on heparin infusion.  Cardiology was consulted by the ED physician, agreed to see the patient in consultation, but requested medical admission.  Review of Systems:  All other  systems reviewed and apart from HPI, are negative.  Past Medical History:  Diagnosis Date  . Bladder tumor   . CAD (coronary artery disease) cardiologist-  dr croitoru    08/ 2000  Cardiac cath w/ stenting x3 (per pt. one vessel);  12/ 2008 cardiac cath w/ angioplasty for in-stent restenosis  . Congestion of throat 09-05-2017  received call back from pt today 1349 , pt had seen his pcp , was told just his sinuses   09-05-2017 per pt congestion in throat , cough, occasion productive yellow, no fever (advised pt to see his pcp prior to surgery 09-07-2017)  . COPD (chronic obstructive pulmonary disease) ([place])   . Diverticulosis of colon   . Full dentures   . Gross hematuria   . History of adenomatous polyp of colon    2007; 2012; 2015  . History of basal cell carcinoma (BCC) excision    05/ 2014  nose  . History of exercise stress test 09-08-2016   dr croitoru   no evidence of stress induced ischmeia;  per dr croitoru note did not show evidence of any new blockages, blood pressure did increase excessively w/ exercise (not unexpected since we stopped metoprolol for the test)  . Hyperlipidemia   . Hypertension   . Peripheral arterial occlusive disease [facility])    09/ 2009  dx right SFA occlusion by dx dr Scot Dock  . Pulmonary nodule    RLL in 2001  . S/P coronary artery stent placement 03/1999   one vessel  PCI and stents x3  . 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 Wears glasses     Past Surgical History:  Procedure Laterality Date  . APPENDECTOMY  1971  . CARDIOVASCULAR STRESS TEST  07-08-2010  dr croitoru   normal perfusion nuclear study w/ no evicende ischemia/  normal LV function and wall motion , ef 70%  . COLONOSCOPY  last one 04-02-2014  . CORONARY ANGIOPLASTY  12/ 2000   dr tysinger   angioplasty for in-stent restenosis  . CORONARY ANGIOPLASTY WITH STENT PLACEMENT  08/ 2000  dr tysinger   stenting x3  (per pt one vessel)  . CYSTOSCOPY W/ RETROGRADES Bilateral 09/07/2017    Procedure: CYSTOSCOPY WITH BILATERAL RETROGRADE PYELOGRAM;  Surgeon: [name], MD;  Location: [facility];  Service: Urology;  Laterality: Bilateral;  . CYSTOSCOPY WITH URETHRAL DILATATION N/A 09/07/2017   Procedure: CYSTOSCOPY WITH URETHRAL DILATATION;  Surgeon: [name], MD;  Location: [facility];  Service: Urology;  Laterality: N/A;  . TONSILLECTOMY  1951  . TRANSURETHRAL RESECTION OF BLADDER TUMOR N/A 09/07/2017   Procedure: TRANSURETHRAL RESECTION OF BLADDER TUMOR  EPIRUBICIN(TURBT);  Surgeon: [name], MD;  Location: [facility];  Service: Urology;  Laterality: N/A;  . [facility]     reports that he quit smoking about 7 years ago. His smoking use included cigarettes. He has a 40.00 pack-year smoking history. He has never used smokeless tobacco. He reports that he does not drink alcohol or use drugs.  No Known Allergies  Family History  Adopted: Yes  Family history unknown: Yes     Prior to Admission medications   Medication Sig Start Date End Date Taking? Authorizing Provider  amLODipine (NORVASC) 10 MG tablet Take 10 mg by mouth daily.  06/28/16  Yes [provider]  aspirin EC 81 MG tablet Take 81 mg by mouth daily.   Yes [provider]  ibuprofen (ADVIL,MOTRIN) 200 MG tablet Take 400 mg by mouth every 6 (six) hours as needed for headache or mild pain.   Yes [provider]  irbesartan (AVAPRO) 150 MG tablet Take 150 mg by mouth every morning.  06/28/16  Yes [provider]  metoprolol succinate (TOPROL-XL) 100 MG 24 hr tablet Take 0.5 tablets by mouth 2 (two) times daily.  06/28/16  Yes [provider]  rosuvastatin (CRESTOR) 10 MG tablet Take 10 mg by mouth every evening.  06/28/16  Yes [provider]    Physical Exam: Vitals:   09/15/18 1807 09/15/18 1808 09/15/18 1833 09/15/18 1834  BP: (!) 174/67 (!) 174/67 139/66   Pulse: 71 69 66     Resp: (!) 24  (!) 26   Temp:      TempSrc:      SpO2: 94% 95% 95%   Weight:    79.4 kg  Height: 5\' 9"  (1.753 m)       Constitutional: NAD, calm  Eyes: PERTLA, lids and conjunctivae normal ENMT: Mucous membranes are moist. Posterior pharynx clear of any exudate or lesions.   Neck: normal, supple, no masses, no thyromegaly Respiratory: clear to auscultation bilaterally, no wheezing, no crackles. Normal respiratory effort.    Cardiovascular: S1 & S2 heard, regular rate and rhythm. No extremity edema.   Abdomen: No distension, no tenderness, soft. Bowel sounds active.  Musculoskeletal: no clubbing / cyanosis. No joint deformity upper and lower extremities.   Skin: no significant rashes, lesions, ulcers. Warm, dry,  well-perfused. Neurologic: No facial asymmetry. Sensation intact. Moving all extremities.  Psychiatric: Alert and oriented x 3. Normal mood and affect.    Labs on Admission: I have personally reviewed following labs and imaging studies  CBC: Recent Labs  Lab 09/15/18 1747  WBC 10.3  HGB 15.0  HCT 46.2  MCV 85.9  PLT 762   Basic Metabolic Panel: Recent Labs  Lab 09/15/18 1747  NA 140  K 3.6  CL 101  CO2 27  GLUCOSE 174*  BUN 22  CREATININE 0.85  CALCIUM 9.4   GFR: Estimated Creatinine Clearance: 70.5 mL/min (by C-G formula based on SCr of 0.85 mg/dL). Liver Function Tests: No results for input(s): AST, ALT, ALKPHOS, BILITOT, PROT, ALBUMIN in the last 168 hours. No results for input(s): LIPASE, AMYLASE in the last 168 hours. No results for input(s): AMMONIA in the last 168 hours. Coagulation Profile: Recent Labs  Lab 09/15/18 1812  INR 1.01   Cardiac Enzymes: No results for input(s): CKTOTAL, CKMB, CKMBINDEX, TROPONINI in the last 168 hours. BNP (last 3 results) No results for input(s): PROBNP in the last 8760 hours. HbA1C: No results for input(s): HGBA1C in the last 72 hours. CBG: No results for input(s): GLUCAP in the last 168 hours. Lipid  Profile: No results for input(s): CHOL, HDL, LDLCALC, TRIG, CHOLHDL, LDLDIRECT in the last 72 hours. Thyroid Function Tests: No results for input(s): TSH, T4TOTAL, FREET4, T3FREE, THYROIDAB in the last 72 hours. Anemia Panel: No results for input(s): VITAMINB12, FOLATE, FERRITIN, TIBC, IRON, RETICCTPCT in the last 72 hours. Urine analysis:    Component Value Date/Time   COLORURINE YELLOW 05/02/2010 0019   APPEARANCEUR CLEAR 05/02/2010 0019   LABSPEC 1.025 05/02/2010 0019   PHURINE 6.0 05/02/2010 0019   GLUCOSEU NEGATIVE 05/02/2010 0019   HGBUR TRACE (A) 05/02/2010 0019   BILIRUBINUR NEGATIVE 05/02/2010 0019   KETONESUR NEGATIVE 05/02/2010 0019   PROTEINUR NEGATIVE 05/02/2010 0019   UROBILINOGEN 1.0 05/02/2010 0019   NITRITE NEGATIVE 05/02/2010 0019   LEUKOCYTESUR NEGATIVE 05/02/2010 0019   Sepsis Labs: @LABRCNTIP (procalcitonin:4,lacticidven:4) )No results found for this or any previous visit (from the past 240 hour(s)).   Radiological Exams on Admission: Dg Chest 2 View  Result Date: 09/15/2018 CLINICAL DATA:  Shortness of breath EXAM: CHEST - 2 VIEW COMPARISON:  July 26, 2018, Jan 21, 2016 FINDINGS: The heart size and mediastinal contours are within normal limits. There is a nodule in the right upper lobe unchanged compared to prior chest x-ray of 2017. There is no focal infiltrate, pulmonary edema, or pleural effusion. The visualized skeletal structures are stable. IMPRESSION: No active cardiopulmonary disease. Stable nodule in the right upper lobe unchanged compared to prior exam of 2017. This is benign as more than 2 years stability is established. Electronically Signed   By: Abelardo Diesel M.D.   On: 09/15/2018 18:40    EKG: Independently reviewed. Sinus rhythm, RAD, ST-depressions V3-V6.    Assessment/Plan   1. Unstable angina; CAD - Presents with new dyspnea with mild exertion for a few days, then left shoulder and arm ache today that did not seem worse with exertion  but was relieved with NTG  - He has hx of stents in 2000 and angioplasty for restenosis in 2008, and excercise stress test in Jan '18 with no ECG evidence of stress-induced ischemia  - EKG features new ST-depressions in anterolateral leads; troponin 0.00; CXR with no acute findings  - He was given ASA 324 mg, nitroglycerin ointment, and started on IV heparin  infusion in ED  - Cardiology is consulting and much appreciated, will follow-up on recommendations  - Continue cardiac monitoring, obtain serial troponin measurements, trend EKG, and continue ASA, statin, Toprol, ARB    2. Hypertension  - BP was high initially, now at goal after NTG  - Continue Toprol, ARB, Norvasc     DVT prophylaxis: IV heparin infusion  Code Status: Full  Family Communication: Discussed with patient  Consults called: Cardiology  Admission status: Observation     Vianne Bulls, MD [facility] Pager [phone]  If 7PM-7AM, please contact night-coverage [email or web address] Password [facility]  09/15/2018, 7:25 PM

## 2018-09-15 NOTE — ED Triage Notes (Signed)
Patient to ED c/o L arm pain onset this morning - states pain is underneath L shoulderblade and goes down his arm, aching in nature, constant, and not reproducible. Took two ibuprofen without relief. Also endorsing shortness of breath. Hx cardiac stents, HTN. Denies N/V or dizziness. Resp e/u, skin warm/dry.

## 2018-09-16 ENCOUNTER — Observation Stay (HOSPITAL_BASED_OUTPATIENT_CLINIC_OR_DEPARTMENT_OTHER): Payer: Medicare Other

## 2018-09-16 DIAGNOSIS — R0609 Other forms of dyspnea: Secondary | ICD-10-CM | POA: Diagnosis not present

## 2018-09-16 DIAGNOSIS — M542 Cervicalgia: Secondary | ICD-10-CM

## 2018-09-16 DIAGNOSIS — I2510 Atherosclerotic heart disease of native coronary artery without angina pectoris: Secondary | ICD-10-CM | POA: Diagnosis not present

## 2018-09-16 DIAGNOSIS — I10 Essential (primary) hypertension: Secondary | ICD-10-CM | POA: Diagnosis not present

## 2018-09-16 DIAGNOSIS — R0789 Other chest pain: Secondary | ICD-10-CM

## 2018-09-16 DIAGNOSIS — R079 Chest pain, unspecified: Secondary | ICD-10-CM | POA: Diagnosis not present

## 2018-09-16 LAB — LIPID PANEL
Cholesterol: 94 mg/dL (ref 0–200)
HDL: 47 mg/dL (ref >40)
LDL Cholesterol: 41 mg/dL (ref 0–99)
Total CHOL/HDL Ratio: 2 RATIO
Triglycerides: 32 mg/dL (ref <150)
VLDL: 6 mg/dL (ref 0–40)

## 2018-09-16 LAB — CBC
HCT: 39.8 % (ref 39.0–52.0)
Hemoglobin: 13.1 g/dL (ref 13.0–17.0)
MCH: 27.8 pg (ref 26.0–34.0)
MCHC: 32.9 g/dL (ref 30.0–36.0)
MCV: 84.5 fL (ref 80.0–100.0)
Platelets: 189 10*3/uL (ref 150–400)
RBC: 4.71 MIL/uL (ref 4.22–5.81)
RDW: 12.7 % (ref 11.5–15.5)
WBC: 9.3 10*3/uL (ref 4.0–10.5)
nRBC: 0 % (ref 0.0–0.2)

## 2018-09-16 LAB — BASIC METABOLIC PANEL
Anion gap: 12 (ref 5–15)
BUN: 20 mg/dL (ref 8–23)
CO2: 25 mmol/L (ref 22–32)
CREATININE: 0.78 mg/dL (ref 0.61–1.24)
Calcium: 8.5 mg/dL — ABNORMAL LOW (ref 8.9–10.3)
Chloride: 100 mmol/L (ref 98–111)
GFR calc Af Amer: >60 mL/min (ref >60)
GFR calc non Af Amer: >60 mL/min (ref >60)
Glucose, Bld: 91 mg/dL (ref 70–99)
Potassium: 3.6 mmol/L (ref 3.5–5.1)
Sodium: 137 mmol/L (ref 135–145)

## 2018-09-16 LAB — HEPARIN LEVEL (UNFRACTIONATED)
Heparin Unfractionated: 0.48 IU/mL (ref 0.30–0.70)
Heparin Unfractionated: 0.53 IU/mL (ref 0.30–0.70)

## 2018-09-16 LAB — TROPONIN I
Troponin I: <0.03 ng/mL (ref <0.03)
Troponin I: <0.03 ng/mL (ref <0.03)

## 2018-09-16 LAB — ECHOCARDIOGRAM COMPLETE
Height: 69 in
Weight: 2920 oz

## 2018-09-16 MED ORDER — LIDOCAINE 5 % EX PTCH: 1.0000 | MEDICATED_PATCH | CUTANEOUS | 0 refills | Status: DC

## 2018-09-16 MED ORDER — PERFLUTREN LIPID MICROSPHERE
1.0000 mL | INTRAVENOUS | Status: AC | PRN
  Administered 2018-09-16: 5 mL via INTRAVENOUS
  Filled 2018-09-16: qty 10

## 2018-09-16 MED ORDER — LIDOCAINE 5 % EX PTCH
1.0000 | MEDICATED_PATCH | CUTANEOUS | Status: DC
  Administered 2018-09-16: 1 via TRANSDERMAL
  Filled 2018-09-16: qty 1

## 2018-09-16 NOTE — Progress Notes (Signed)
ANTICOAGULATION CONSULT NOTE   Pharmacy Consult for Heparin Indication: chest pain/ACS  No Known Allergies  Patient Measurements: Height: 5\' 9"  (175.3 cm) Weight: 182 lb 8 oz (82.8 kg) IBW/kg (Calculated) : 70.7 Heparin Dosing Weight: 79kg  Vital Signs: Temp: 97.7 F (36.5 C) (01/19 0621) Temp Source: Oral (01/19 0621) BP: 139/68 (01/19 0621) Pulse Rate: 58 (01/19 0621)  Labs: Recent Labs    09/15/18 1747 09/15/18 1812 09/15/18 2032 09/16/18 0214 09/16/18 0721 09/16/18 1134  HGB 15.0  --   --  13.1  --   --   HCT 46.2  --   --  39.8  --   --   PLT 240  --   --  189  --   --   APTT  --  29  --   --   --   --   LABPROT  --  13.2  --   --   --   --   INR  --  1.01  --   --   --   --   HEPARINUNFRC  --   --   --  0.48  --  0.53  CREATININE 0.85  --   --   --  0.78  --   TROPONINI  --   --  <0.03 <0.03 <0.03  --     Estimated Creatinine Clearance: 74.9 mL/min (by C-G formula based on SCr of 0.78 mg/dL).   Medical History: Past Medical History:  Diagnosis Date  . Bladder tumor   . CAD (coronary artery disease) cardiologist-  dr croitoru    08/ 2000  Cardiac cath w/ stenting x3 (per pt. one vessel);  12/ 2008 cardiac cath w/ angioplasty for in-stent restenosis  . Congestion of throat 09-05-2017  received call back from pt today 1349 , pt had seen his pcp , was told just his sinuses   09-05-2017 per pt congestion in throat , cough, occasion productive yellow, no fever (advised pt to see his pcp prior to surgery 09-07-2017)  . COPD (chronic obstructive pulmonary disease) ([place])   . Diverticulosis of colon   . Full dentures   . Gross hematuria   . History of adenomatous polyp of colon    2007; 2012; 2015  . History of basal cell carcinoma (BCC) excision    05/ 2014  nose  . History of exercise stress test 09-08-2016   dr croitoru   no evidence of stress induced ischmeia;  per dr croitoru note did not show evidence of any new blockages, blood pressure did increase  excessively w/ exercise (not unexpected since we stopped metoprolol for the test)  . Hyperlipidemia   . Hypertension   . Peripheral arterial occlusive disease [facility])    09/ 2009  dx right SFA occlusion by dx dr Scot Dock  . Pulmonary nodule    RLL in 2001  . S/P coronary artery stent placement 03/1999   one vessel PCI and stents x3  . 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 Wears glasses     Medications:  Medications Prior to Admission  Medication Sig Dispense Refill Last Dose  . amLODipine (NORVASC) 10 MG tablet Take 10 mg by mouth daily.   0 09/15/2018 at Unknown time  . aspirin EC 81 MG tablet Take 81 mg by mouth daily.   09/15/2018 at Unknown time  . ibuprofen (ADVIL,MOTRIN) 200 MG tablet Take 400 mg by mouth every 6 (six) hours as needed for headache or mild pain.  09/15/2018 at Unknown time  . irbesartan (AVAPRO) 150 MG tablet Take 150 mg by mouth every morning.   0 09/15/2018 at Unknown time  . metoprolol succinate (TOPROL-XL) 100 MG 24 hr tablet Take 0.5 tablets by mouth 2 (two) times daily.   0 09/15/2018 at 0700  . rosuvastatin (CRESTOR) 10 MG tablet Take 10 mg by mouth every evening.   0 09/14/2018 at Unknown time   Scheduled:  . amLODipine  10 mg Oral Daily  . aspirin EC  81 mg Oral Daily  . irbesartan  150 mg Oral q morning - 10a  . lidocaine  1 patch Transdermal Q24H  . metoprolol succinate  50 mg Oral BID  . rosuvastatin  10 mg Oral q1800   Infusions:  . heparin 1,200 Units/hr (09/15/18 2031)    Assessment: Pt presented with arm pain. He has a hx of CAD with multiple stents. Heparin has been ordered to r/o MI. He is on no PTA anticoagulants. Heparin remains therapeutic, CBC stable this morning.  Goal of Therapy:  Heparin level 0.3-0.7 units/ml Monitor platelets by anticoagulation protocol: Yes   Plan:  Heparin infusion 1200 units/hr Daily heparin level and CBC  Arrie Senate, PharmD, BCPS Clinical Pharmacist [phone] Please check AMION for all [facility]  numbers 09/16/2018

## 2018-09-16 NOTE — Discharge Summary (Signed)
Physician Discharge Summary  Jordan Chambers WNU:[id number] DOB: February 23, 1941 DOA: 09/15/2018  PCP: Deland Pretty, MD  Admit date: 09/15/2018 Discharge date: 09/16/2018  Admitted From: Home Disposition:  Home  Recommendations for Outpatient Follow-up:  1. Follow up with PCP in 1-2 weeks 2. Please obtain BMP/CBC in one week 3. Follow up with Dr. Sallyanne Kuster in office- please call and schedule appointment  Home Health: No  Equipment/Devices: None   Discharge Condition: Stable  CODE STATUS: Full code  Diet recommendation: Heart healthy  Brief/Interim Summary: Jordan Chambers is a 77 y.o. male with medical history significant for hypertension and coronary artery disease with stents, now presenting to the emergency department for evaluation of exertional dyspnea and left arm and upper back pain.  Patient presents with recent development of dyspnea with minimal exertion, most notable for the past 2 days.  Today, he developed pain in the upper back, at the base of his neck, worse with leaning his head back, mild, and constant.  He also has developed an ache in his left shoulder and arm today that was also constant, but unchanged by movement, and resolved with nitroglycerin in the ED.  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  Denies cough, fevers, chills, leg swelling or tenderness, abdominal pain, nausea, vomiting, or diarrhea.  Reports that he never had any chest pain or shortness of breath prior to his stents or angioplasty, but states that he was found to have CAD on some routine testing that was treated.  ED Course: Upon arrival to the ED, patient is found to be afebrile, saturating well on room air, and with vitals otherwise normal.  EKG features a sinus rhythm with RAD and ST depressions in leads V3 through V6.  Chest x-ray is negative for acute cardiopulmonary disease.   Chemistry panel is notable for glucose of 174 and CBC is unremarkable.  INR is normal and troponin is 0.00.  Patient was given 324 mg of aspirin, nitroglycerin ointment, and started on heparin infusion.  Cardiology was consulted by the ED physician, agreed to see the patient in consultation, but requested medical admission.  Patient was seen by Dr. Debara Pickett, cardiologist, who did not feel his pain was cardiac in etiology.  Patient underwent echocardiogram which showed an EF of 60-65% with no wall abnormalities.  He was given a lidocaine patch for musculoskeletal pain.  He was discharged with instruction to follow up with Dr. Sallyanne Kuster outpatient.   Discharge Diagnoses:  Principal Problem:   Other chest pain Active Problems:   HTN (hypertension)   CAD (coronary artery disease)   Cervicalgia    Discharge Instructions  Discharge Instructions    Call MD for:  persistant nausea and vomiting   Complete by:  As directed    Call MD for:  severe uncontrolled pain   Complete by:  As directed    Call MD for:  temperature >100.4   Complete by:  As directed    Diet - low sodium heart healthy   Complete by:  As directed    Discharge instructions   Complete by:  As directed    Follow up with your cardiologist   Increase activity slowly   Complete by:  As directed      Allergies as of 09/16/2018   No Known Allergies     Medication List    TAKE these medications  amLODipine 10 MG tablet Commonly known as:  NORVASC Take 10 mg by mouth daily.   aspirin EC 81 MG tablet Take 81 mg by mouth daily.   ibuprofen 200 MG tablet Commonly known as:  ADVIL,MOTRIN Take 400 mg by mouth every 6 (six) hours as needed for headache or mild pain.   irbesartan 150 MG tablet Commonly known as:  AVAPRO Take 150 mg by mouth every morning.   lidocaine 5 % Commonly known as:  LIDODERM Place 1 patch onto the skin daily. Remove & Discard patch within 12 hours or as directed by MD Start taking on:  September 17, 2018   metoprolol succinate 100 MG 24 hr tablet Commonly known as:  TOPROL-XL Take 0.5 tablets by mouth 2 (two) times daily.   rosuvastatin 10 MG tablet Commonly known as:  CRESTOR Take 10 mg by mouth every evening.      Follow-up Information    Croitoru, Mihai, MD. Schedule an appointment as soon as possible for a visit.   Specialty:  Cardiology Contact information: [address] [place] [address] [phone]          No Known Allergies  Consultations:  Cardiology    Procedures/Studies: Dg Chest 2 View  Result Date: 09/15/2018 CLINICAL DATA:  Shortness of breath EXAM: CHEST - 2 VIEW COMPARISON:  July 26, 2018, Jan 21, 2016 FINDINGS: The heart size and mediastinal contours are within normal limits. There is a nodule in the right upper lobe unchanged compared to prior chest x-ray of 2017. There is no focal infiltrate, pulmonary edema, or pleural effusion. The visualized skeletal structures are stable. IMPRESSION: No active cardiopulmonary disease. Stable nodule in the right upper lobe unchanged compared to prior exam of 2017. This is benign as more than 2 years stability is established. Electronically Signed   By: Abelardo Diesel M.D.   On: 09/15/2018 18:40     Subjective: Patient reporting still tenderness in the back.  Report nitroglycerin patch did not help much. No shortness of breath or chest pressure.  Patient asking if he can eat.  Cardiology already evaluated patient.    Discharge Exam: Vitals:   09/15/18 2020 09/16/18 0621  BP: (!) 166/69 139/68  Pulse: 64 (!) 58  Resp: 20 20  Temp: 97.6 F (36.4 C) 97.7 F (36.5 C)  SpO2: 96% 99%   Vitals:   09/15/18 1930 09/15/18 2011 09/15/18 2020 09/16/18 0621  BP: 120/68  (!) 166/69 139/68  Pulse: (!) 56  64 (!) 58  Resp: (!) 21  20 20   Temp:   97.6 F (36.4 C) 97.7 F (36.5 C)  TempSrc:   Oral Oral  SpO2: 94%  96% 99%  Weight:  83.8 kg  82.8 kg  Height:  5\' 9"  (1.753 m)       General: Pt is alert, awake, not in acute distress Cardiovascular: RRR, S1/S2 +, no rubs, no gallops Respiratory: CTA bilaterally, no wheezing, no rhonchi Abdominal: Soft, NT, ND, bowel sounds + Extremities: no edema, no cyanosis  Sinus bradycardia  The results of significant diagnostics from this hospitalization (including imaging, microbiology, ancillary and laboratory) are listed below for reference.     Microbiology: No results found for this or any previous visit (from the past 240 hour(s)).   Labs: BNP (last 3 results) No results for input(s): BNP in the last 8760 hours. Basic Metabolic Panel: Recent Labs  Lab 09/15/18 1747 09/16/18 0721  NA 140 137  K 3.6 3.6  CL  101 100  CO2 27 25  GLUCOSE 174* 91  BUN 22 20  CREATININE 0.85 0.78  CALCIUM 9.4 8.5*   Liver Function Tests: No results for input(s): AST, ALT, ALKPHOS, BILITOT, PROT, ALBUMIN in the last 168 hours. No results for input(s): LIPASE, AMYLASE in the last 168 hours. No results for input(s): AMMONIA in the last 168 hours. CBC: Recent Labs  Lab 09/15/18 1747 09/16/18 0214  WBC 10.3 9.3  HGB 15.0 13.1  HCT 46.2 39.8  MCV 85.9 84.5  PLT 240 189   Cardiac Enzymes: Recent Labs  Lab 09/15/18 2032 09/16/18 0214 09/16/18 0721  TROPONINI <0.03 <0.03 <0.03   BNP: Invalid input(s): POCBNP CBG: No results for input(s): GLUCAP in the last 168 hours. D-Dimer No results for input(s): DDIMER in the last 72 hours. Hgb A1c Recent Labs    09/15/18 2032  HGBA1C 6.0*   Lipid Profile Recent Labs    09/16/18 0214  CHOL 94  HDL 47  LDLCALC 41  TRIG 32  CHOLHDL 2.0   Thyroid function studies No results for input(s): TSH, T4TOTAL, T3FREE, THYROIDAB in the last 72 hours.  Invalid input(s): FREET3 Anemia work up No results for input(s): VITAMINB12, FOLATE, FERRITIN, TIBC, IRON, RETICCTPCT in the last 72 hours. Urinalysis    Component Value Date/Time   COLORURINE YELLOW 05/02/2010 0019    APPEARANCEUR CLEAR 05/02/2010 0019   LABSPEC 1.025 05/02/2010 0019   PHURINE 6.0 05/02/2010 0019   GLUCOSEU NEGATIVE 05/02/2010 0019   HGBUR TRACE (A) 05/02/2010 0019   BILIRUBINUR NEGATIVE 05/02/2010 0019   KETONESUR NEGATIVE 05/02/2010 0019   PROTEINUR NEGATIVE 05/02/2010 0019   UROBILINOGEN 1.0 05/02/2010 0019   NITRITE NEGATIVE 05/02/2010 0019   LEUKOCYTESUR NEGATIVE 05/02/2010 0019   Sepsis Labs Invalid input(s): PROCALCITONIN,  WBC,  LACTICIDVEN Microbiology No results found for this or any previous visit (from the past 240 hour(s)).   Time coordinating discharge: 30 minutes  SIGNED:   Loretha Stapler, MD  [facility] 09/16/2018, 2:12 PM Pager [phone] If 7PM-7AM, please contact night-coverage [email or web address] Password [email or web address]

## 2018-09-16 NOTE — Progress Notes (Signed)
I reviewed discharge instructions with patient and family at bedside. No further questions. Pt discharged in stable condition via wheelchair to private vehicle

## 2018-09-16 NOTE — Progress Notes (Signed)
  Echocardiogram 2D Echocardiogram has been performed.  Jordan Chambers 09/16/2018, 10:34 AM

## 2018-09-16 NOTE — Discharge Instructions (Signed)
Musculoskeletal Pain  Musculoskeletal pain refers to aches and pains in your bones, joints, muscles, and the tissues that surround them. This pain can occur in any part of the body. It can last for a short time (acute) or a long time (chronic).  A physical exam, lab tests, and imaging studies may be done to find the cause of your musculoskeletal pain.  Follow these instructions at home:    Lifestyle   Try to control or lower your stress levels. Stress increases muscle tension and can worsen musculoskeletal pain. It is important to recognize when you are anxious or stressed and learn ways to manage it. This may include:  ? Meditation or yoga.  ? Cognitive or behavioral therapy.  ? Acupuncture or massage therapy.   You may continue all activities unless the activities cause more pain. When the pain gets better, slowly resume your normal activities. Gradually increase the intensity and duration of your activities or exercise.  Managing pain, stiffness, and swelling   Take over-the-counter and prescription medicines only as told by your health care provider.   When your pain is severe, bed rest may be helpful. Lie or sit in any position that is comfortable, but get out of bed and walk around at least every couple of hours.   If directed, apply heat to the affected area as often as told by your health care provider. Use the heat source that your health care provider recommends, such as a moist heat pack or a heating pad.  ? Place a towel between your skin and the heat source.  ? Leave the heat on for 20-30 minutes.  ? Remove the heat if your skin turns bright red. This is especially important if you are unable to feel pain, heat, or cold. You may have a greater risk of getting burned.   If directed, put ice on the painful area.  ? Put ice in a plastic bag.  ? Place a towel between your skin and the bag.  ? Leave the ice on for 20 minutes, 2-3 times a day.  General instructions   Your health care provider may  recommend that you see a physical therapist. This person can help you come up with a safe exercise program. Do any exercises as told by your physical therapist.   Keep all follow-up visits, including any physical therapy visits, as told by your health care providers. This is important.  Contact a health care provider if:   Your pain gets worse.   Medicines do not help ease your pain.   You cannot use the part of your body that hurts, such as your arm, leg, or neck.   You have trouble sleeping.   You have trouble doing your normal activities.  Get help right away if:   You have a new injury and your pain is worse or different.   You feel numb or you have tingling in the painful area.  Summary   Musculoskeletal pain refers to aches and pains in your bones, joints, muscles, and the tissues that surround them.   This pain can occur in any part of the body.   Your health care provider may recommend that you see a physical therapist. This person can help you come up with a safe exercise program. Do any exercises as told by your physical therapist.   Lower your stress level. Stress can worsen musculoskeletal pain. Ways to lower stress may include meditation, yoga, cognitive or behavioral therapy, acupuncture, and massage   therapy.  This information is not intended to replace advice given to you by your health care provider. Make sure you discuss any questions you have with your health care provider.  Document Released: 08/15/2005 Document Revised: 09/14/2016 Document Reviewed: 09/14/2016  Elsevier Interactive Patient Education  2019 [employer]

## 2018-09-16 NOTE — Progress Notes (Signed)
DAILY PROGRESS NOTE   Patient Name: Jordan Chambers Date of Encounter: 09/16/2018  Chief Complaint   Neck pain  Patient Profile   Jordan Chambers is a 78 y.o. male with a hx of CAD with prior PCI, PAD, HTN, HLD, COPD, who is being seen today for the evaluation of shoulder pain and dyspnea at the request of Margarita Mail.  Subjective   No chest pain - dyspnea only with exertion. Has neck pain, worse with flexion - also ache in the inside of his left arm, worse laying on left side or taking a deep breath. Troponins all negative overnight. Labs otherwise unremarkable. LDL low at 41. Echo is in progress.   Objective   Vitals:   09/15/18 1930 09/15/18 2011 09/15/18 2020 09/16/18 0621  BP: 120/68  (!) 166/69 139/68  Pulse: (!) 56  64 (!) 58  Resp: (!) 21  20 20   Temp:   97.6 F (36.4 C) 97.7 F (36.5 C)  TempSrc:   Oral Oral  SpO2: 94%  96% 99%  Weight:  83.8 kg  82.8 kg  Height:  5\' 9"  (1.753 m)     No intake or output data in the 24 hours ending 09/16/18 0929 Filed Weights   09/15/18 1834 09/15/18 2011 09/16/18 0621  Weight: 79.4 kg 83.8 kg 82.8 kg    Physical Exam   General appearance: alert and no distress Lungs: clear to auscultation bilaterally Heart: regular rate and rhythm, S1, S2 normal, no murmur, click, rub or gallop Extremities: extremities normal, atraumatic, no cyanosis or edema Neurologic: Grossly normal  Inpatient Medications    Scheduled Meds: . amLODipine  10 mg Oral Daily  . aspirin EC  81 mg Oral Daily  . irbesartan  150 mg Oral q morning - 10a  . metoprolol succinate  50 mg Oral BID  . rosuvastatin  10 mg Oral q1800    Continuous Infusions: . heparin 1,200 Units/hr (09/15/18 2031)    PRN Meds: acetaminophen, morphine injection, nitroGLYCERIN, ondansetron (ZOFRAN) IV   Labs   Results for orders placed or performed during the hospital encounter of 09/15/18 (from the past 48 hour(s))  Basic metabolic panel     Status: Abnormal   Collection  Time: 09/15/18  5:47 PM  Result Value Ref Range   Sodium 140 135 - 145 mmol/L   Potassium 3.6 3.5 - 5.1 mmol/L   Chloride 101 98 - 111 mmol/L   CO2 27 22 - 32 mmol/L   Glucose, Bld 174 (H) 70 - 99 mg/dL   BUN 22 8 - 23 mg/dL   Creatinine, Ser 0.85 0.61 - 1.24 mg/dL   Calcium 9.4 8.9 - 10.3 mg/dL   GFR calc non Af Amer >60 >60 mL/min   GFR calc Af Amer >60 >60 mL/min   Anion gap 12 5 - 15    Comment: Performed at [facility], [place] [address], [place] [address]  CBC     Status: None   Collection Time: 09/15/18  5:47 PM  Result Value Ref Range   WBC 10.3 4.0 - 10.5 K/uL   RBC 5.38 4.22 - 5.81 MIL/uL   Hemoglobin 15.0 13.0 - 17.0 g/dL   HCT 46.2 39.0 - 52.0 %   MCV 85.9 80.0 - 100.0 fL   MCH 27.9 26.0 - 34.0 pg   MCHC 32.5 30.0 - 36.0 g/dL   RDW 12.8 11.5 - 15.5 %   Platelets 240 150 - 400 K/uL  nRBC 0.0 0.0 - 0.2 %    Comment: Performed at [facility], [place] [address], [place], [place] [address]  I-stat troponin, ED     Status: None   Collection Time: 09/15/18  6:01 PM  Result Value Ref Range   Troponin i, poc 0.00 0.00 - 0.08 ng/mL   Comment 3            Comment: Due to the release kinetics of cTnI, a negative result within the first hours of the onset of symptoms does not rule out myocardial infarction with certainty. If myocardial infarction is still suspected, repeat the test at appropriate intervals.   APTT     Status: None   Collection Time: 09/15/18  6:12 PM  Result Value Ref Range   aPTT 29 24 - 36 seconds    Comment: Performed at [facility] [address], [place], [place] [address]  Protime-INR     Status: None   Collection Time: 09/15/18  6:12 PM  Result Value Ref Range   Prothrombin Time 13.2 11.4 - 15.2 seconds   INR 1.01     Comment: Performed at [facility] [address], [place], [place] [address]  Troponin I - Now Then Q6H     Status: None   Collection Time: 09/15/18  8:32 PM  Result Value Ref Range   Troponin  I <0.03 <0.03 ng/mL    Comment: Performed at [facility] [address] [place], [place] [address]  Hemoglobin A1c     Status: Abnormal   Collection Time: 09/15/18  8:32 PM  Result Value Ref Range   Hgb A1c MFr Bld 6.0 (H) 4.8 - 5.6 %    Comment: (NOTE) Pre diabetes:          5.7%-6.4% Diabetes:              >6.4% Glycemic control for   <7.0% adults with diabetes    Mean Plasma Glucose 125.5 mg/dL    Comment: Performed at [facility] [address] [place], [place] [address]  Heparin level (unfractionated)     Status: None   Collection Time: 09/16/18  2:14 AM  Result Value Ref Range   Heparin Unfractionated 0.48 0.30 - 0.70 IU/mL    Comment: (NOTE) If heparin results are below expected values, and patient dosage has  been confirmed, suggest follow up testing of antithrombin III levels. Performed at [facility], [place] [address], [place] [address]   CBC     Status: None   Collection Time: 09/16/18  2:14 AM  Result Value Ref Range   WBC 9.3 4.0 - 10.5 K/uL   RBC 4.71 4.22 - 5.81 MIL/uL   Hemoglobin 13.1 13.0 - 17.0 g/dL   HCT 39.8 39.0 - 52.0 %   MCV 84.5 80.0 - 100.0 fL   MCH 27.8 26.0 - 34.0 pg   MCHC 32.9 30.0 - 36.0 g/dL   RDW 12.7 11.5 - 15.5 %   Platelets 189 150 - 400 K/uL   nRBC 0.0 0.0 - 0.2 %    Comment: Performed at [facility], [place] [address], [place],  [address]  Troponin I - Now Then Q6H     Status: None   Collection Time: 09/16/18  2:14 AM  Result Value Ref Range   Troponin I <0.03 <0.03 ng/mL    Comment: Performed at [facility] [place],  [place] [address]  Lipid panel     Status: None   Collection Time: 09/16/18  2:14 AM  Result Value Ref Range   Cholesterol 94 0 - 200 mg/dL   Triglycerides 32 <150 mg/dL   HDL 47 >40 mg/dL   Total CHOL/HDL Ratio 2.0 RATIO   VLDL 6 0 - 40 mg/dL   LDL Cholesterol 41 0 - 99 mg/dL    Comment:        Total Cholesterol/HDL:CHD Risk Coronary Heart  Disease Risk Table                     Men   Women  1/2 Average Risk   3.4   3.3  Average Risk       5.0   4.4  2 X Average Risk   9.6   7.1  3 X Average Risk  23.4   11.0        Use the calculated Patient Ratio above and the CHD Risk Table to determine the patient's CHD Risk.        ATP III CLASSIFICATION (LDL):  <100     mg/dL   Optimal  100-129  mg/dL   Near or Above                    Optimal  130-159  mg/dL   Borderline  160-189  mg/dL   High  >190     mg/dL   Very High Performed at [facility] [address], [place], [place] [address]   Troponin I - Now Then Q6H     Status: None   Collection Time: 09/16/18  7:21 AM  Result Value Ref Range   Troponin I <0.03 <0.03 ng/mL    Comment: Performed at [facility] [address], [place], [place] [address]  Basic metabolic panel     Status: Abnormal   Collection Time: 09/16/18  7:21 AM  Result Value Ref Range   Sodium 137 135 - 145 mmol/L   Potassium 3.6 3.5 - 5.1 mmol/L   Chloride 100 98 - 111 mmol/L   CO2 25 22 - 32 mmol/L   Glucose, Bld 91 70 - 99 mg/dL   BUN 20 8 - 23 mg/dL   Creatinine, Ser 0.78 0.61 - 1.24 mg/dL   Calcium 8.5 (L) 8.9 - 10.3 mg/dL   GFR calc non Af Amer >60 >60 mL/min   GFR calc Af Amer >60 >60 mL/min   Anion gap 12 5 - 15    Comment: Performed at [facility], [place] [address], [place], [place] [address]    ECG   Sinus bradycardia at 56 - Personally Reviewed  Telemetry   Sinus bradycardia - Personally Reviewed  Radiology    Dg Chest 2 View  Result Date: 09/15/2018 CLINICAL DATA:  Shortness of breath EXAM: CHEST - 2 VIEW COMPARISON:  July 26, 2018, Jan 21, 2016 FINDINGS: The heart size and mediastinal contours are within normal limits. There is a nodule in the right upper lobe unchanged compared to prior chest x-ray of 2017. There is no focal infiltrate, pulmonary edema, or pleural effusion. The visualized skeletal structures are stable. IMPRESSION: No active  cardiopulmonary disease. Stable nodule in the right upper lobe unchanged compared to prior exam of 2017. This is benign as more than 2 years stability is established. Electronically Signed   By: Abelardo Diesel M.D.   On: 09/15/2018 18:40    Cardiac  Studies   Echo pending  Assessment   Principal Problem:   Other chest pain Active Problems:   HTN (hypertension)   CAD (coronary artery disease)   Cervicalgia   Plan   1.  Symptoms are atypical for angina in my opinion. Troponins are negative overnight. Echo in progress. Will review - if normal LV function and no new WMA's, probably can be discharged to follow-up with Dr. Sallyanne Kuster in the office. Consider trial of anti-inflammatories for MSK/cervicalgia pain.  Time Spent Directly with Patient:  I have spent a total of 25 minutes with the patient reviewing hospital notes, telemetry, EKGs, labs and examining the patient as well as establishing an assessment and plan that was discussed personally with the patient.  > 50% of time was spent in direct patient care.  Length of Stay:  LOS: 0 days   Pixie Casino, MD, [facility], [place] Director of the Advanced Lipid Disorders &  Cardiovascular Risk Reduction Clinic Diplomate of the American Board of Clinical Lipidology Attending Cardiologist  Direct Dial: [phone]  Fax: [phone]  Website:  [email or web address] 09/16/2018, 9:29 AM

## 2018-09-16 NOTE — Progress Notes (Signed)
ANTICOAGULATION CONSULT NOTE   Pharmacy Consult for Heparin Indication: chest pain/ACS  No Known Allergies  Patient Measurements: Height: 5\' 9"  (175.3 cm) Weight: 184 lb 11.9 oz (83.8 kg) IBW/kg (Calculated) : 70.7 Heparin Dosing Weight: 79kg  Vital Signs: Temp: 97.6 F (36.4 C) (01/18 2020) Temp Source: Oral (01/18 2020) BP: 166/69 (01/18 2020) Pulse Rate: 64 (01/18 2020)  Labs: Recent Labs    09/15/18 1747 09/15/18 1812 09/15/18 2032 09/16/18 0214  HGB 15.0  --   --  13.1  HCT 46.2  --   --  39.8  PLT 240  --   --  189  APTT  --  29  --   --   LABPROT  --  13.2  --   --   INR  --  1.01  --   --   HEPARINUNFRC  --   --   --  0.48  CREATININE 0.85  --   --   --   TROPONINI  --   --  <0.03  --     Estimated Creatinine Clearance: 70.5 mL/min (by C-G formula based on SCr of 0.85 mg/dL).   Medical History: Past Medical History:  Diagnosis Date  . Bladder tumor   . CAD (coronary artery disease) cardiologist-  dr croitoru    08/ 2000  Cardiac cath w/ stenting x3 (per pt. one vessel);  12/ 2008 cardiac cath w/ angioplasty for in-stent restenosis  . Congestion of throat 09-05-2017  received call back from pt today 1349 , pt had seen his pcp , was told just his sinuses   09-05-2017 per pt congestion in throat , cough, occasion productive yellow, no fever (advised pt to see his pcp prior to surgery 09-07-2017)  . COPD (chronic obstructive pulmonary disease) ([place])   . Diverticulosis of colon   . Full dentures   . Gross hematuria   . History of adenomatous polyp of colon    2007; 2012; 2015  . History of basal cell carcinoma (BCC) excision    05/ 2014  nose  . History of exercise stress test 09-08-2016   dr croitoru   no evidence of stress induced ischmeia;  per dr croitoru note did not show evidence of any new blockages, blood pressure did increase excessively w/ exercise (not unexpected since we stopped metoprolol for the test)  . Hyperlipidemia   . Hypertension   .  Peripheral arterial occlusive disease [facility])    09/ 2009  dx right SFA occlusion by dx dr Scot Dock  . Pulmonary nodule    RLL in 2001  . S/P coronary artery stent placement 03/1999   one vessel PCI and stents x3  . 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 Wears glasses     Medications:  Medications Prior to Admission  Medication Sig Dispense Refill Last Dose  . amLODipine (NORVASC) 10 MG tablet Take 10 mg by mouth daily.   0 09/15/2018 at Unknown time  . aspirin EC 81 MG tablet Take 81 mg by mouth daily.   09/15/2018 at Unknown time  . ibuprofen (ADVIL,MOTRIN) 200 MG tablet Take 400 mg by mouth every 6 (six) hours as needed for headache or mild pain.   09/15/2018 at Unknown time  . irbesartan (AVAPRO) 150 MG tablet Take 150 mg by mouth every morning.   0 09/15/2018 at Unknown time  . metoprolol succinate (TOPROL-XL) 100 MG 24 hr tablet Take 0.5 tablets by mouth 2 (two) times daily.   0 09/15/2018 at  0700  . rosuvastatin (CRESTOR) 10 MG tablet Take 10 mg by mouth every evening.   0 09/14/2018 at Unknown time   Scheduled:  . amLODipine  10 mg Oral Daily  . aspirin EC  81 mg Oral Daily  . irbesartan  150 mg Oral q morning - 10a  . metoprolol succinate  50 mg Oral BID  . rosuvastatin  10 mg Oral q1800   Infusions:  . heparin 1,200 Units/hr (09/15/18 2031)    Assessment: Pt presented with arm pain. He has a hx of CAD with multiple stents. Heparin has been ordered to r/o MI. He is on no PTA anticoagulants.  Scr 0.85, hgb 15, plt 240.  1/19 AM update: initial heparin level is therapeutic   Goal of Therapy:  Heparin level 0.3-0.7 units/ml Monitor platelets by anticoagulation protocol: Yes   Plan:  Heparin infusion 1200 units/hr Confirmatory heparin level in 8 hours Daily HL and CBC  Narda Bonds, PharmD, BCPS Clinical Pharmacist Phone: [phone]

## 2018-09-17 DIAGNOSIS — Z8551 Personal history of malignant neoplasm of bladder: Secondary | ICD-10-CM | POA: Diagnosis not present

## 2018-09-17 DIAGNOSIS — R3121 Asymptomatic microscopic hematuria: Secondary | ICD-10-CM | POA: Diagnosis not present

## 2018-09-19 DIAGNOSIS — M898X1 Other specified disorders of bone, shoulder: Secondary | ICD-10-CM | POA: Diagnosis not present

## 2018-09-19 DIAGNOSIS — J8410 Pulmonary fibrosis, unspecified: Secondary | ICD-10-CM | POA: Diagnosis not present

## 2018-09-20 DIAGNOSIS — Z125 Encounter for screening for malignant neoplasm of prostate: Secondary | ICD-10-CM | POA: Diagnosis not present

## 2018-09-20 DIAGNOSIS — I10 Essential (primary) hypertension: Secondary | ICD-10-CM | POA: Diagnosis not present

## 2018-09-21 DIAGNOSIS — M25512 Pain in left shoulder: Secondary | ICD-10-CM | POA: Diagnosis not present

## 2018-09-24 DIAGNOSIS — Z0000 Encounter for general adult medical examination without abnormal findings: Secondary | ICD-10-CM | POA: Diagnosis not present

## 2018-09-24 DIAGNOSIS — I2510 Atherosclerotic heart disease of native coronary artery without angina pectoris: Secondary | ICD-10-CM | POA: Diagnosis not present

## 2018-09-24 DIAGNOSIS — I70209 Unspecified atherosclerosis of native arteries of extremities, unspecified extremity: Secondary | ICD-10-CM | POA: Diagnosis not present

## 2018-09-24 DIAGNOSIS — J449 Chronic obstructive pulmonary disease, unspecified: Secondary | ICD-10-CM | POA: Diagnosis not present

## 2018-09-27 DIAGNOSIS — M25512 Pain in left shoulder: Secondary | ICD-10-CM | POA: Diagnosis not present

## 2018-12-26 DIAGNOSIS — Z8551 Personal history of malignant neoplasm of bladder: Secondary | ICD-10-CM | POA: Diagnosis not present

## 2019-03-25 DIAGNOSIS — Z8551 Personal history of malignant neoplasm of bladder: Secondary | ICD-10-CM | POA: Diagnosis not present

## 2019-03-25 DIAGNOSIS — N35011 Post-traumatic bulbous urethral stricture: Secondary | ICD-10-CM | POA: Diagnosis not present

## 2019-04-01 DIAGNOSIS — C678 Malignant neoplasm of overlapping sites of bladder: Secondary | ICD-10-CM | POA: Diagnosis not present

## 2019-04-01 DIAGNOSIS — Z5111 Encounter for antineoplastic chemotherapy: Secondary | ICD-10-CM | POA: Diagnosis not present

## 2019-05-27 DIAGNOSIS — Z23 Encounter for immunization: Secondary | ICD-10-CM | POA: Diagnosis not present

## 2019-06-24 DIAGNOSIS — R3121 Asymptomatic microscopic hematuria: Secondary | ICD-10-CM | POA: Diagnosis not present

## 2019-06-24 DIAGNOSIS — Z8551 Personal history of malignant neoplasm of bladder: Secondary | ICD-10-CM | POA: Diagnosis not present

## 2019-06-24 DIAGNOSIS — N35011 Post-traumatic bulbous urethral stricture: Secondary | ICD-10-CM | POA: Diagnosis not present

## 2019-07-18 DIAGNOSIS — H2513 Age-related nuclear cataract, bilateral: Secondary | ICD-10-CM | POA: Diagnosis not present

## 2019-08-14 ENCOUNTER — Telehealth: Payer: Medicare Other | Admitting: Cardiovascular Disease

## 2019-09-13 ENCOUNTER — Ambulatory Visit: Payer: Medicare Other

## 2019-09-19 DIAGNOSIS — Z8551 Personal history of malignant neoplasm of bladder: Secondary | ICD-10-CM | POA: Diagnosis not present

## 2019-09-19 DIAGNOSIS — N35011 Post-traumatic bulbous urethral stricture: Secondary | ICD-10-CM | POA: Diagnosis not present

## 2019-09-20 IMAGING — DX DG CHEST 2V
2 series · 2 of 2 positions shown · non-contrast
Comparison: 05/02/2010

CLINICAL DATA: Shortness of breath and productive cough.

EXAM:
CHEST - 2 VIEW

[chest pa]
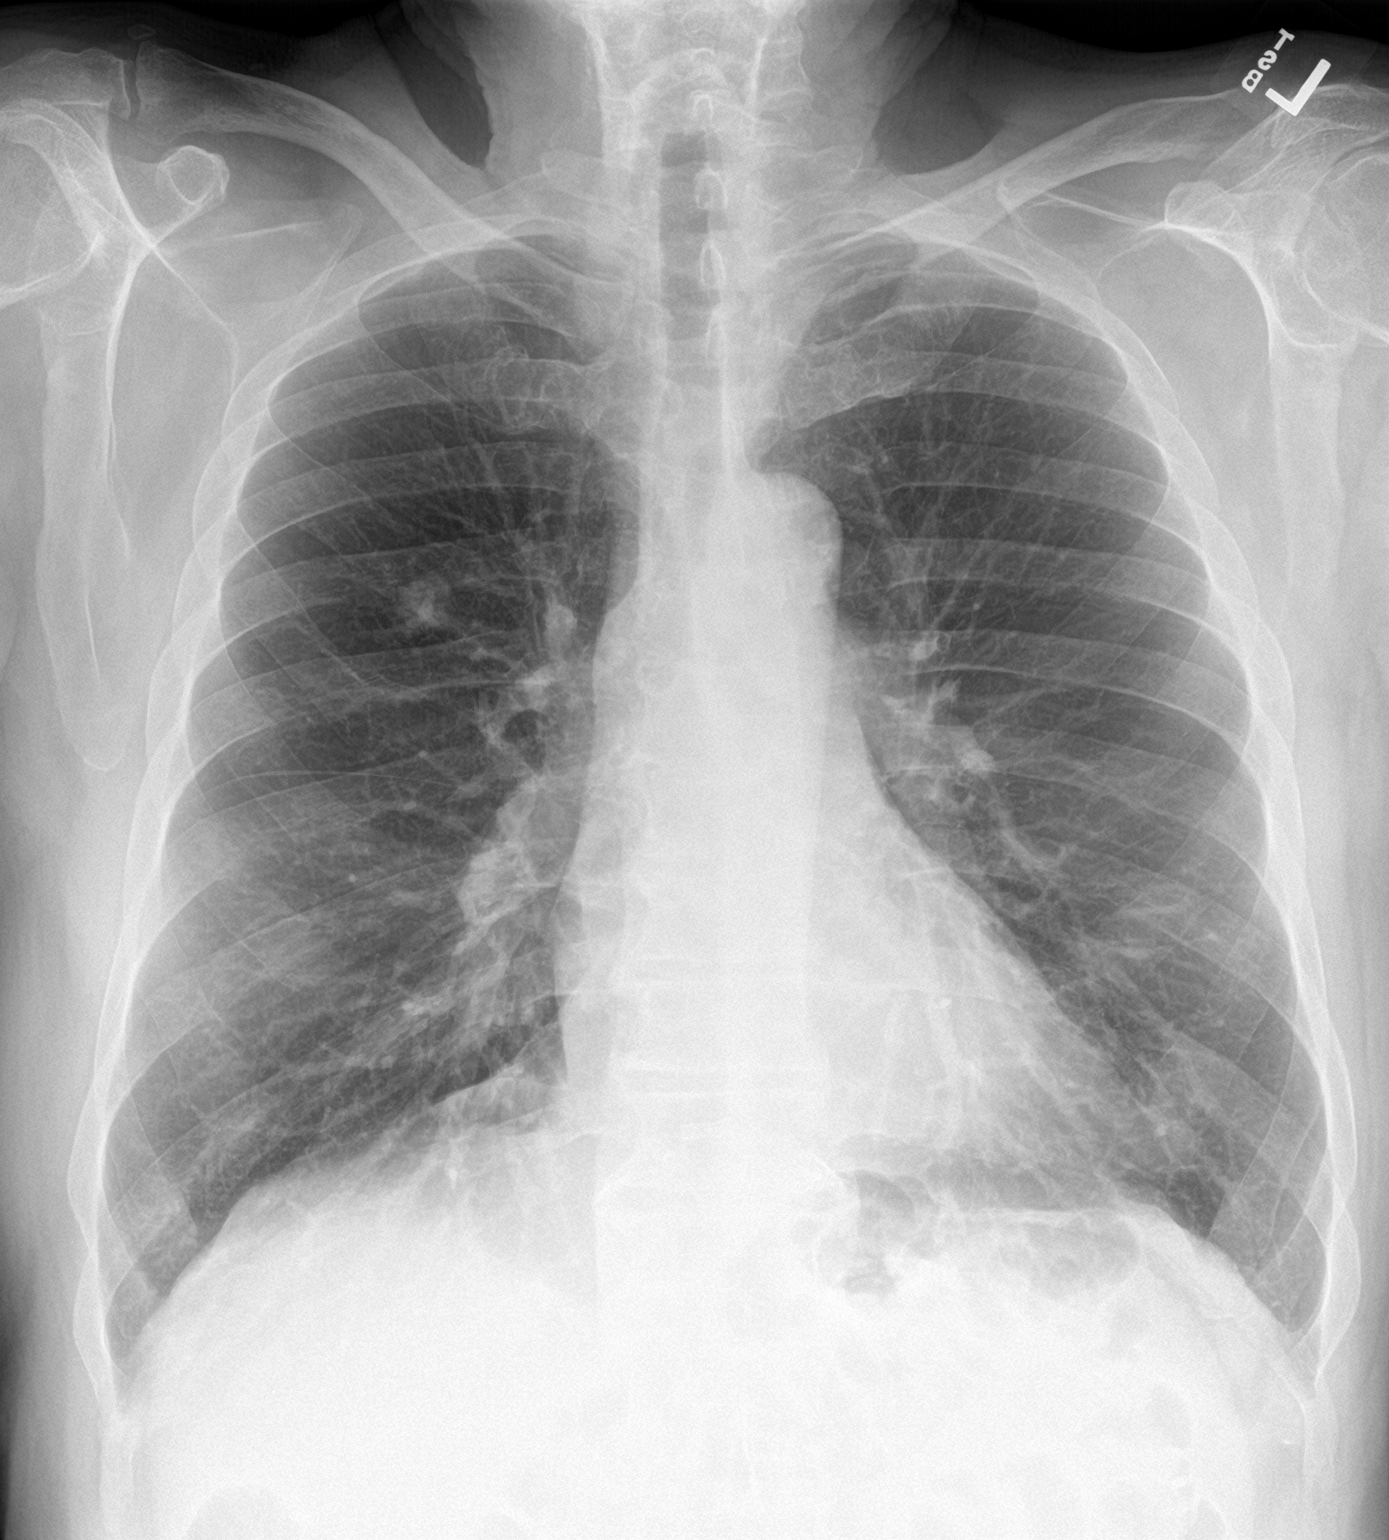

[chest lat]
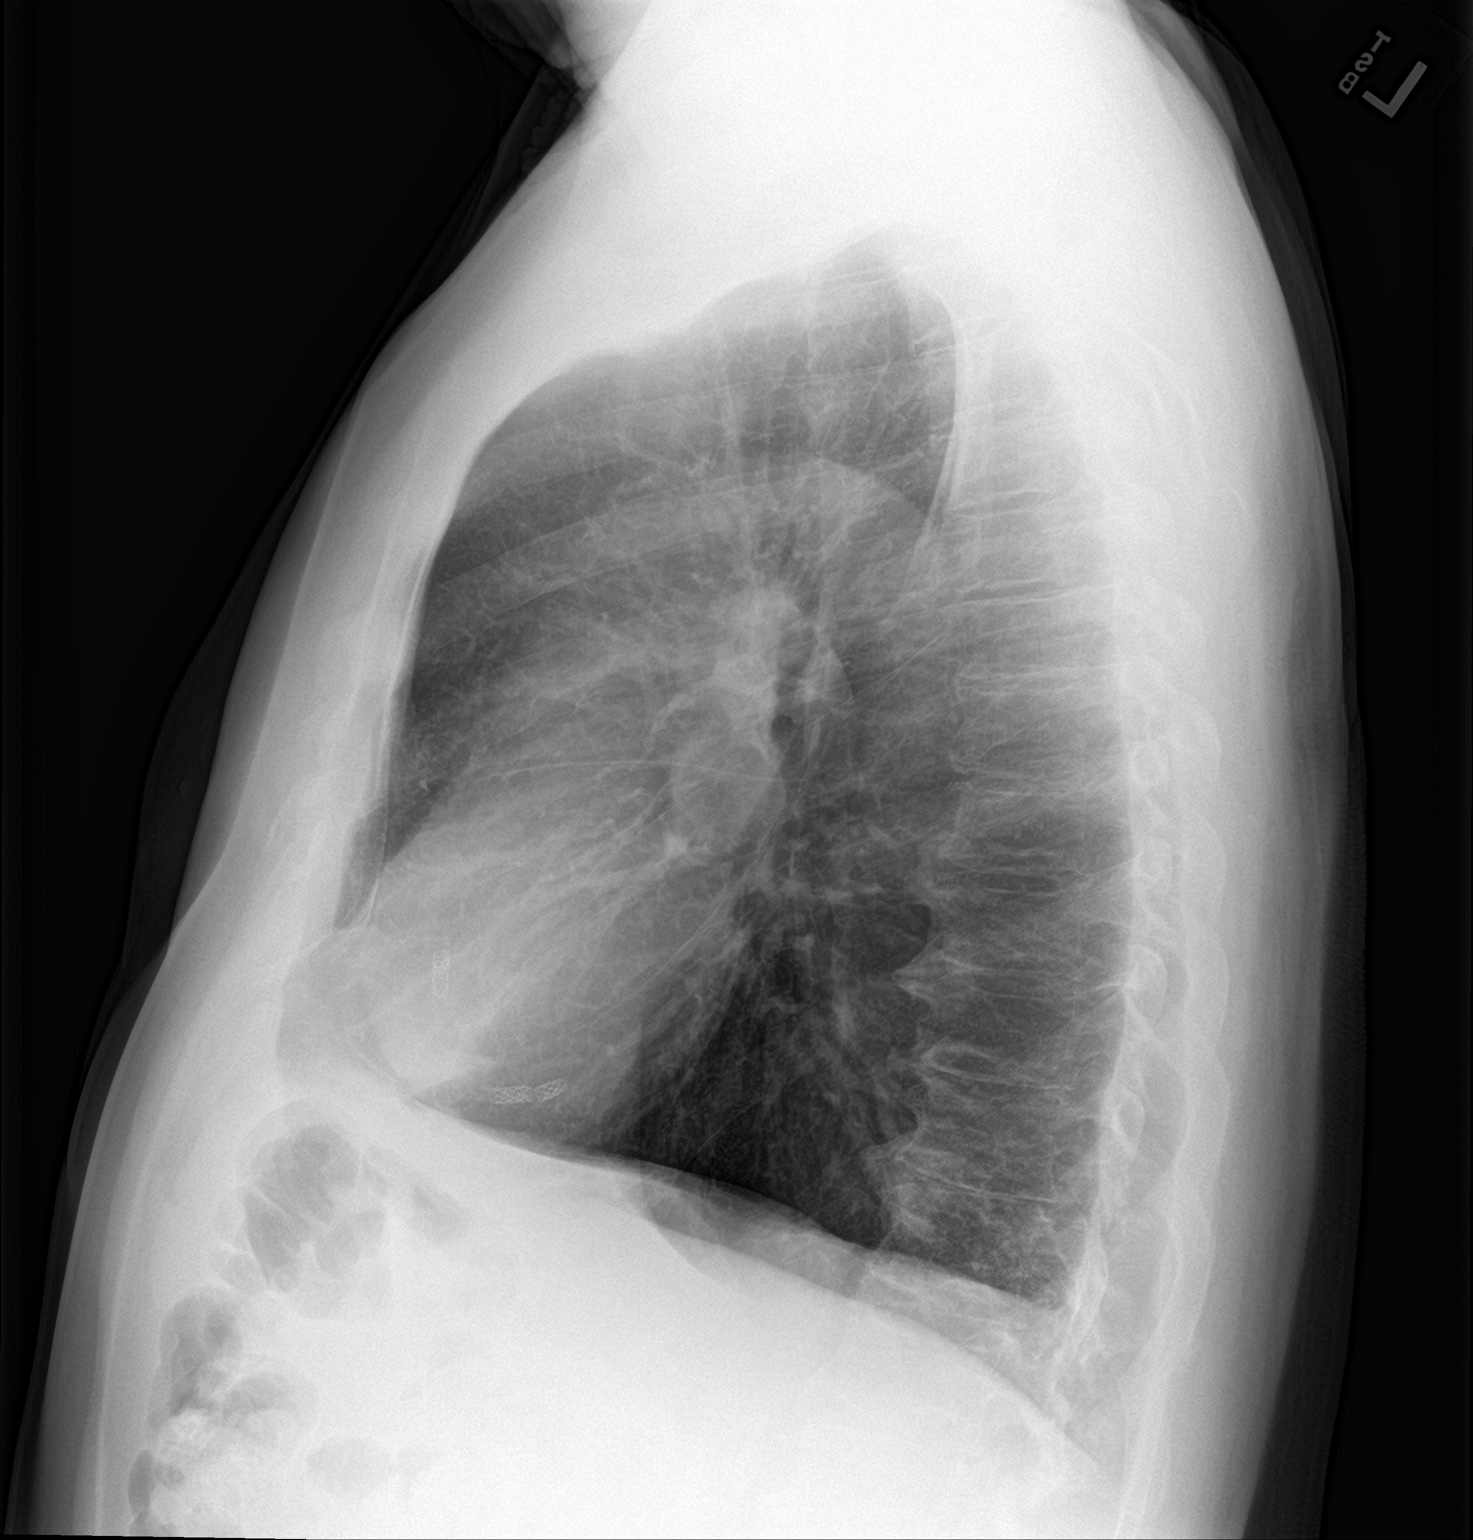

[2 of 2 positions shown; findings below may reference images not displayed]

FINDINGS: The heart size and mediastinal contours are within normal limits.
Stable right lung nodule previously demonstrated by CT to lie within
the superior segment of the right lower lobe. There is no evidence
of pulmonary edema, consolidation, pneumothorax, nodule or pleural
fluid. The visualized skeletal structures are unremarkable.
IMPRESSION: No active cardiopulmonary disease.  Stable right lower lobe nodule.

## 2019-09-27 DIAGNOSIS — R945 Abnormal results of liver function studies: Secondary | ICD-10-CM | POA: Diagnosis not present

## 2019-09-27 DIAGNOSIS — E7800 Pure hypercholesterolemia, unspecified: Secondary | ICD-10-CM | POA: Diagnosis not present

## 2019-09-27 DIAGNOSIS — I10 Essential (primary) hypertension: Secondary | ICD-10-CM | POA: Diagnosis not present

## 2019-10-02 DIAGNOSIS — I70209 Unspecified atherosclerosis of native arteries of extremities, unspecified extremity: Secondary | ICD-10-CM | POA: Diagnosis not present

## 2019-10-02 DIAGNOSIS — I2510 Atherosclerotic heart disease of native coronary artery without angina pectoris: Secondary | ICD-10-CM | POA: Diagnosis not present

## 2019-10-02 DIAGNOSIS — Z0001 Encounter for general adult medical examination with abnormal findings: Secondary | ICD-10-CM | POA: Diagnosis not present

## 2019-10-02 DIAGNOSIS — J449 Chronic obstructive pulmonary disease, unspecified: Secondary | ICD-10-CM | POA: Diagnosis not present

## 2019-10-16 DIAGNOSIS — C678 Malignant neoplasm of overlapping sites of bladder: Secondary | ICD-10-CM | POA: Diagnosis not present

## 2019-10-16 DIAGNOSIS — Z5111 Encounter for antineoplastic chemotherapy: Secondary | ICD-10-CM | POA: Diagnosis not present

## 2019-10-23 DIAGNOSIS — C678 Malignant neoplasm of overlapping sites of bladder: Secondary | ICD-10-CM | POA: Diagnosis not present

## 2019-10-23 DIAGNOSIS — Z5111 Encounter for antineoplastic chemotherapy: Secondary | ICD-10-CM | POA: Diagnosis not present

## 2019-10-30 DIAGNOSIS — C678 Malignant neoplasm of overlapping sites of bladder: Secondary | ICD-10-CM | POA: Diagnosis not present

## 2019-10-30 DIAGNOSIS — R8271 Bacteriuria: Secondary | ICD-10-CM | POA: Diagnosis not present

## 2019-11-06 DIAGNOSIS — C678 Malignant neoplasm of overlapping sites of bladder: Secondary | ICD-10-CM | POA: Diagnosis not present

## 2019-11-06 DIAGNOSIS — Z5111 Encounter for antineoplastic chemotherapy: Secondary | ICD-10-CM | POA: Diagnosis not present

## 2019-11-18 DIAGNOSIS — M1611 Unilateral primary osteoarthritis, right hip: Secondary | ICD-10-CM | POA: Diagnosis not present

## 2019-11-25 DIAGNOSIS — I70209 Unspecified atherosclerosis of native arteries of extremities, unspecified extremity: Secondary | ICD-10-CM | POA: Diagnosis not present

## 2019-11-25 DIAGNOSIS — J449 Chronic obstructive pulmonary disease, unspecified: Secondary | ICD-10-CM | POA: Diagnosis not present

## 2019-11-25 DIAGNOSIS — R001 Bradycardia, unspecified: Secondary | ICD-10-CM | POA: Diagnosis not present

## 2019-11-25 DIAGNOSIS — R0602 Shortness of breath: Secondary | ICD-10-CM | POA: Diagnosis not present

## 2019-12-02 DIAGNOSIS — M1611 Unilateral primary osteoarthritis, right hip: Secondary | ICD-10-CM | POA: Diagnosis not present

## 2019-12-11 ENCOUNTER — Ambulatory Visit: Payer: Medicare Other | Admitting: Cardiovascular Disease

## 2019-12-11 ENCOUNTER — Other Ambulatory Visit: Payer: Self-pay

## 2019-12-11 ENCOUNTER — Encounter: Payer: Self-pay | Admitting: Cardiovascular Disease

## 2019-12-11 VITALS — BP 148/62 | HR 53 | Temp 97.5°F | Ht 69.0 in | Wt 176.0 lb

## 2019-12-11 DIAGNOSIS — I2510 Atherosclerotic heart disease of native coronary artery without angina pectoris: Secondary | ICD-10-CM | POA: Diagnosis not present

## 2019-12-11 DIAGNOSIS — I10 Essential (primary) hypertension: Secondary | ICD-10-CM | POA: Diagnosis not present

## 2019-12-11 DIAGNOSIS — I739 Peripheral vascular disease, unspecified: Secondary | ICD-10-CM

## 2019-12-11 DIAGNOSIS — E7800 Pure hypercholesterolemia, unspecified: Secondary | ICD-10-CM

## 2019-12-11 MED ORDER — METOPROLOL SUCCINATE ER 50 MG PO TB24: 50.0000 mg | ORAL_TABLET | Freq: Every day | ORAL | 11 refills | Status: DC

## 2019-12-11 MED ORDER — IRBESARTAN 300 MG PO TABS: 300.0000 mg | ORAL_TABLET | Freq: Every morning | ORAL | 11 refills | Status: DC

## 2019-12-11 NOTE — Patient Instructions (Signed)
Medication Instructions:  INCREASE the Irbesartan to 300 mg once daily DECREASE the Metoprolol to 50 mg once daily in the evening.   *If you need a refill on your cardiac medications before your next appointment, please call your pharmacy*   Lab Work: None ordered If you have labs (blood work) drawn today and your tests are completely normal, you will receive your results only by: Marland Kitchen MyChart Message (if you have MyChart) OR . A paper copy in the mail If you have any lab test that is abnormal or we need to change your treatment, we will call you to review the results.   Testing/Procedures: None ordered   Follow-Up: At [facility], you and your health needs are our priority.  As part of our continuing mission to provide you with exceptional heart care, we have created designated Provider Care Teams.  These Care Teams include your primary Cardiologist (physician) and Advanced Practice Providers (APPs -  Physician Assistants and Nurse Practitioners) who all work together to provide you with the care you need, when you need it.  We recommend signing up for the patient portal called "MyChart".  Sign up information is provided on this After Visit Summary.  MyChart is used to connect with patients for Virtual Visits (Telemedicine).  Patients are able to view lab/test results, encounter notes, upcoming appointments, etc.  Non-urgent messages can be sent to your provider as well.   To learn more about what you can do with MyChart, go to [email or web address].    Your next appointment:   12 month(s)  The format for your next appointment:   In Person  Provider:   You may see Sanda Klein, MD or one of the following Advanced Practice Providers on your designated Care Team:    Almyra Deforest, PA-C  Fabian Sharp, PA-C or   Roby Lofts, [place]

## 2019-12-11 NOTE — Progress Notes (Addendum)
Cardiology Office Note    Date:  12/13/2019   ID:  Jordan Chambers, DOB 28-Aug-1941, MRN [id number]  PCP:  Deland Pretty, MD  Cardiologist:   Sanda Klein, MD   Chief Complaint  Patient presents with  . Coronary Artery Disease    History of Present Illness:  Jordan Chambers is a 79 y.o. male with a long-standing history of coronary artery disease, detected by treadmill stress testing 2000, treated with placement of 3 stents and a subsequent angioplasty for in-stent restenosis, but without any recurrent events in the last 20 years.  He has hypertension and hypercholesterolemia.  He had a normal nuclear stress test in 2011 and a normal ECG treadmill stress test in 2018.  He has some mild exertional dyspnea and saw a pulmonary specialist.  He was told that his chest x-ray was normal.  He quit smoking about 10 years ago.  He has had some left hip pain which has improved following a cortisone shot.  The patient specifically denies any chest pain at rest or with exertion, dyspnea at rest, orthopnea, paroxysmal nocturnal dyspnea, syncope, palpitations, focal neurological deficits, intermittent claudication, lower extremity edema, unexplained weight gain, cough, hemoptysis.  He has occasional wheezing.  As in the past, he has had some intermittent swelling and discomfort in his left knee and left calf, but previous ultrasound has not shown evidence of DVT.  His most recent LDL cholesterol is not available for review, but he continues to take rosuvastatin daily.  His blood pressure is a little high today at 148/62, but at home it is typically in the 120s-130s.  In 2009 lower extremity arterial Dopplers showed a right ABI of 0.57 (obstruction at the level of the thigh with monophasic popliteal and tibial waveforms) with a normal left ABI of 0.95.  She does not complain of claudication.    Past Medical History:  Diagnosis Date  . Bladder tumor   . CAD (coronary artery disease) cardiologist-  dr  Jordan Chambers    08/ 2000  Cardiac cath w/ stenting x3 (per pt. one vessel);  12/ 2008 cardiac cath w/ angioplasty for in-stent restenosis  . Congestion of throat 09-05-2017  received call back from pt today 1349 , pt had seen his pcp , was told just his sinuses   09-05-2017 per pt congestion in throat , cough, occasion productive yellow, no fever (advised pt to see his pcp prior to surgery 09-07-2017)  . COPD (chronic obstructive pulmonary disease) ([place])   . Diverticulosis of colon   . Full dentures   . Gross hematuria   . History of adenomatous polyp of colon    2007; 2012; 2015  . History of basal cell carcinoma (BCC) excision    05/ 2014  nose  . History of exercise stress test 09-08-2016   dr Jordan Chambers   no evidence of stress induced ischmeia;  per dr Jordan Chambers note did not show evidence of any new blockages, blood pressure did increase excessively w/ exercise (not unexpected since we stopped metoprolol for the test)  . Hyperlipidemia   . Hypertension   . Peripheral arterial occlusive disease [facility])    09/ 2009  dx right SFA occlusion by dx dr Jordan Chambers  . Pulmonary nodule    RLL in 2001  . S/P coronary artery stent placement 03/1999   one vessel PCI and stents x3  . 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 Wears glasses     Past Surgical History:  Procedure Laterality Date  .  APPENDECTOMY  1971  . CARDIOVASCULAR STRESS TEST  07-08-2010  dr Yanni Quiroa   normal perfusion nuclear study w/ no evicende ischemia/  normal LV function and wall motion , ef 70%  . COLONOSCOPY  last one 04-02-2014  . CORONARY ANGIOPLASTY  12/ 2000   dr tysinger   angioplasty for in-stent restenosis  . CORONARY ANGIOPLASTY WITH STENT PLACEMENT  08/ 2000  dr tysinger   stenting x3  (per pt one vessel)  . CYSTOSCOPY W/ RETROGRADES Bilateral 09/07/2017   Procedure: CYSTOSCOPY WITH BILATERAL RETROGRADE PYELOGRAM;  Surgeon: Jordan Seal, MD;  [facility];  Service: Urology;  Laterality: Bilateral;    . CYSTOSCOPY WITH URETHRAL DILATATION N/A 09/07/2017   Procedure: CYSTOSCOPY WITH URETHRAL DILATATION;  Surgeon: Jordan Seal, MD;  Location: [facility];  Service: Urology;  Laterality: N/A;  . TONSILLECTOMY  1951  . TRANSURETHRAL RESECTION OF BLADDER TUMOR N/A 09/07/2017   Procedure: TRANSURETHRAL RESECTION OF BLADDER TUMOR  EPIRUBICIN(TURBT);  Surgeon: Jordan Seal, MD;  Location: [facility];  Service: Urology;  Laterality: N/A;  . [facility]    Current Medications: Outpatient Medications Prior to Visit  Medication Sig Dispense Refill  . amLODipine (NORVASC) 10 MG tablet Take 10 mg by mouth daily.   0  . aspirin EC 81 MG tablet Take 81 mg by mouth daily.    . rosuvastatin (CRESTOR) 10 MG tablet Take 10 mg by mouth every evening.   0  . irbesartan (AVAPRO) 150 MG tablet Take 150 mg by mouth every morning.   0  . metoprolol succinate (TOPROL-XL) 100 MG 24 hr tablet Take 0.5 tablets by mouth 2 (two) times daily.   0  . ibuprofen (ADVIL,MOTRIN) 200 MG tablet Take 400 mg by mouth every 6 (six) hours as needed for headache or mild pain.    Marland Kitchen lidocaine (LIDODERM) 5 % Place 1 patch onto the skin daily. Remove & Discard patch within 12 hours or as directed by MD 30 patch 0   No facility-administered medications prior to visit.     Allergies:   Patient has no known allergies.   Social History   Socioeconomic History  . Marital status: Married    Spouse name: Not on file  . Number of children: Not on file  . Years of education: Not on file  . Highest education level: Not on file  Occupational History  . Not on file  Tobacco Use  . Smoking status: Former Smoker    Packs/day: 1.00    Years: 40.00    Pack years: 40.00    Types: Cigarettes    Quit date: 09/06/2011    Years since quitting: 8.2  . Smokeless tobacco: Never Used  Substance and Sexual Activity  . Alcohol use: No  . Drug use: No  . Sexual activity: Not on file  Other  Topics Concern  . Not on file  Social History Narrative  . Not on file   Social Determinants of Health   Financial Resource Strain:   . Difficulty of Paying Living Expenses:   Food Insecurity:   . Worried About Charity fundraiser in the Last Year:   . Arboriculturist in the Last Year:   Transportation Needs:   . Film/video editor (Medical):   Marland Kitchen Lack of Transportation (Non-Medical):   Physical Activity:   . Days of Exercise per Week:   . Minutes of Exercise per Session:  Stress:   . Feeling of Stress :   Social Connections:   . Frequency of Communication with Friends and Family:   . Frequency of Social Gatherings with Friends and Family:   . Attends Religious Services:   . Active Member of Clubs or Organizations:   . Attends Archivist Meetings:   Marland Kitchen Marital Status:      Family History:  The patient's He was adopted. Family history is unknown by patient.   ROS:   Please see the history of present illness.    ROS All other systems reviewed and are negative.   PHYSICAL EXAM:   VS:  BP (!) 148/62   Pulse (!) 53   Temp (!) 97.5 F (36.4 C)   Ht 5\' 9"  (1.753 m)   Wt 176 lb (79.8 kg)   BMI 25.99 kg/m    Recheck BP 128/72 GEN: Well nourished, well developed, in no acute distress  HEENT: normal  Neck: no JVD, carotid bruits, or masses Cardiac: RRR; no murmurs, rubs, or gallops,no edema . Normal radial, ulnar femoral and left pedal pulses. Diminished right pedal pulses. Respiratory:  clear to auscultation bilaterally, normal work of breathing GI: soft, nontender, nondistended, + BS MS: no deformity or atrophy  Skin: warm and dry, no rash Neuro:  Alert and Oriented x 3, Strength and sensation are intact Psych: euthymic mood, full affect  Wt Readings from Last 3 Encounters:  12/11/19 176 lb (79.8 kg)  09/16/18 182 lb 8 oz (82.8 kg)  07/26/18 175 lb (79.4 kg)      Studies/Labs Reviewed:   EKG:  EKG is ordered today.  The ekg ordered today  demonstrates Mild sinus bradycardia with nonspecific ST segment changes  Recent Labs: Creatinine 0.9   Lipid Panel 2018 Total cholesterol 119, triglycerides 117, HDL 46, LDL 50, A1c 6.2%  09/27/2019 Total cholesterol 110, triglycerides 74, HDL 56, LDL 39 Normal liver function test except borderline alkaline phosphatase 127, including GGT 19.  Calcium 9.2 Potassium 5.0, creatinine 0.83, glucose 90 Hemoglobin 14.4 Additional studies/ records that were reviewed today include:  Notes from Dr. Shelia Media visit date 11/25/2019  ASSESSMENT:    1. Coronary artery disease involving native coronary artery of native heart without angina pectoris   2. PAD (peripheral artery disease) ([place])   3. Hypercholesteremia   4. Essential hypertension      PLAN:  In order of problems listed above:  1. CAD: Asymptomatic.  Last functional study was performed a couple of years ago and was low risk.  He prefers not to repeat treadmill stress test.  He is on aspirin statin and beta-blocker.  He is relatively bradycardic which might explain some of his symptoms.  We will reduce the metoprolol succinate in half. 2. PAD: Longstanding total occlusion of the right superficial femoral artery which is asymptomatic. 3. HLP: Excellent lipid parameters on labs from January 2021 4. HTN: Blood pressure little high today, although lower at home.  We will increase the irbesartan to 300 mg daily when we decreased the beta-blocker due to bradycardia.    Medication Adjustments/Labs and Tests Ordered: Current medicines are reviewed at length with the patient today.  Concerns regarding medicines are outlined above.  Medication changes, Labs and Tests ordered today are listed in the Patient Instructions below. Patient Instructions  Medication Instructions:  INCREASE the Irbesartan to 300 mg once daily DECREASE the Metoprolol to 50 mg once daily in the evening.   *If you need a refill on your cardiac  medications before your next  appointment, please call your pharmacy*   Lab Work: None ordered If you have labs (blood work) drawn today and your tests are completely normal, you will receive your results only by: Marland Kitchen MyChart Message (if you have MyChart) OR . A paper copy in the mail If you have any lab test that is abnormal or we need to change your treatment, we will call you to review the results.   Testing/Procedures: None ordered   Follow-Up: At [facility], you and your health needs are our priority.  As part of our continuing mission to provide you with exceptional heart care, we have created designated Provider Care Teams.  These Care Teams include your primary Cardiologist (physician) and Advanced Practice Providers (APPs -  Physician Assistants and Nurse Practitioners) who all work together to provide you with the care you need, when you need it.  We recommend signing up for the patient portal called "MyChart".  Sign up information is provided on this After Visit Summary.  MyChart is used to connect with patients for Virtual Visits (Telemedicine).  Patients are able to view lab/test results, encounter notes, upcoming appointments, etc.  Non-urgent messages can be sent to your provider as well.   To learn more about what you can do with MyChart, go to [email or web address].    Your next appointment:   12 month(s)  The format for your next appointment:   In Person  Provider:   You may see Sanda Klein, MD or one of the following Advanced Practice Providers on your designated Care Team:    Almyra Deforest, PA-C  Fabian Sharp, [place] or   Roby Lofts, PA-C       Signed, Sanda Klein, MD  12/13/2019 2:54 PM    [facility] [place], [place], [place]  [address] Phone: [phone]; Fax: [phone]

## 2019-12-13 ENCOUNTER — Encounter: Payer: Self-pay | Admitting: Cardiovascular Disease

## 2019-12-25 DIAGNOSIS — I10 Essential (primary) hypertension: Secondary | ICD-10-CM | POA: Diagnosis not present

## 2019-12-30 DIAGNOSIS — I10 Essential (primary) hypertension: Secondary | ICD-10-CM | POA: Diagnosis not present

## 2019-12-31 ENCOUNTER — Institutional Professional Consult (permissible substitution): Payer: Medicare Other | Admitting: Pulmonary Disease

## 2020-03-03 DIAGNOSIS — M1611 Unilateral primary osteoarthritis, right hip: Secondary | ICD-10-CM | POA: Diagnosis not present

## 2020-03-06 DIAGNOSIS — Z8551 Personal history of malignant neoplasm of bladder: Secondary | ICD-10-CM | POA: Diagnosis not present

## 2020-03-11 DIAGNOSIS — I700 Atherosclerosis of aorta: Secondary | ICD-10-CM | POA: Diagnosis not present

## 2020-03-11 DIAGNOSIS — D3500 Benign neoplasm of unspecified adrenal gland: Secondary | ICD-10-CM | POA: Diagnosis not present

## 2020-03-11 DIAGNOSIS — C679 Malignant neoplasm of bladder, unspecified: Secondary | ICD-10-CM | POA: Diagnosis not present

## 2020-03-11 DIAGNOSIS — M1611 Unilateral primary osteoarthritis, right hip: Secondary | ICD-10-CM | POA: Diagnosis not present

## 2020-03-11 DIAGNOSIS — C678 Malignant neoplasm of overlapping sites of bladder: Secondary | ICD-10-CM | POA: Diagnosis not present

## 2020-03-18 DIAGNOSIS — N35011 Post-traumatic bulbous urethral stricture: Secondary | ICD-10-CM | POA: Diagnosis not present

## 2020-03-18 DIAGNOSIS — R351 Nocturia: Secondary | ICD-10-CM | POA: Diagnosis not present

## 2020-03-18 DIAGNOSIS — N401 Enlarged prostate with lower urinary tract symptoms: Secondary | ICD-10-CM | POA: Diagnosis not present

## 2020-03-18 DIAGNOSIS — Z8551 Personal history of malignant neoplasm of bladder: Secondary | ICD-10-CM | POA: Diagnosis not present

## 2020-04-01 DIAGNOSIS — I10 Essential (primary) hypertension: Secondary | ICD-10-CM | POA: Diagnosis not present

## 2020-04-01 DIAGNOSIS — R269 Unspecified abnormalities of gait and mobility: Secondary | ICD-10-CM | POA: Diagnosis not present

## 2020-06-18 DIAGNOSIS — Z85828 Personal history of other malignant neoplasm of skin: Secondary | ICD-10-CM | POA: Diagnosis not present

## 2020-06-18 DIAGNOSIS — L570 Actinic keratosis: Secondary | ICD-10-CM | POA: Diagnosis not present

## 2020-06-18 DIAGNOSIS — L218 Other seborrheic dermatitis: Secondary | ICD-10-CM | POA: Diagnosis not present

## 2020-07-13 DIAGNOSIS — M1611 Unilateral primary osteoarthritis, right hip: Secondary | ICD-10-CM | POA: Diagnosis not present

## 2020-08-03 ENCOUNTER — Telehealth: Payer: Self-pay | Admitting: Cardiovascular Disease

## 2020-08-03 NOTE — Telephone Encounter (Signed)
   [facility] Pre-operative Risk Assessment    HEARTCARE STAFF: - Please ensure there is not already an duplicate clearance open for this procedure. - Under Visit Info/Reason for Call, type in Other and utilize the format Clearance MM/DD/YY or Clearance TBD. Do not use dashes or single digits. - If request is for dental extraction, please clarify the # of teeth to be extracted.  Request for surgical clearance:  1. What type of surgery is being performed? Right total hip replacement  2. When is this surgery scheduled? 10/21/2020  3. What type of clearance is required (medical clearance vs. Pharmacy clearance to hold med vs. Both)? Both   4. Are there any medications that need to be held prior to surgery and how long? [place] 5 to 7 days prior  5. Practice name and name of physician performing surgery? [facility]   6. What is the office phone number? [phone]   7.   What is the office fax number? [phone]  8.   Anesthesia type (None, local, MAC, general) ? General    Jordan Chambers 08/03/2020, 9:48 AM  _________________________________________________________________   (provider comments below)

## 2020-08-03 NOTE — Telephone Encounter (Signed)
Appt schedule with Coletta Memos on 02/02

## 2020-08-03 NOTE — Telephone Encounter (Signed)
Probably best to see him in January if plan for February surgery. Can we bring in on one of those mid Jan days, please?

## 2020-08-03 NOTE — Telephone Encounter (Signed)
   Primary Cardiologist: Sanda Klein, MD  Chart reviewed as part of pre-operative protocol coverage. Patient has not been seen in our office since 11/2019 and procedure is not scheduled until 09/2020. Because of Jordan Chambers's past medical history and time since last visit, he will require a follow-up visit in order to better assess preoperative cardiovascular risk.  Pre-op covering staff: - Please schedule appointment and call patient to inform them. If patient already had an upcoming appointment within acceptable timeframe, please add "pre-op clearance" to the appointment notes so provider is aware. - Please contact requesting surgeon's office via preferred method (i.e, phone, fax) to inform them of need for appointment prior to surgery.  Will also go ahead and route message to Dr. Sallyanne Kuster for recommendations for holding Aspirin so that this is available at upcoming appointment in case patient is seen by APP. Dr. Sallyanne Kuster, patient has history of CAD with placement of 3 stents and subsequent angioplasty for in-stent restenosis but no recurrent events in the last 20 years. Last ETT in 2018 showed no evidence of ischemia. Is it OK to hold Aspirin for 5-7 days prior to hip replacement?  Thank you! Darreld Mclean, PA-C  08/03/2020, 10:37 AM

## 2020-08-04 DIAGNOSIS — H2513 Age-related nuclear cataract, bilateral: Secondary | ICD-10-CM | POA: Diagnosis not present

## 2020-08-04 NOTE — Telephone Encounter (Signed)
Patient has an appointment 08/18/20 with APP.

## 2020-08-04 NOTE — Telephone Encounter (Signed)
Left a message for the patient to call back to schedule an earlier appointment with Dr. Sallyanne Kuster.

## 2020-08-05 DIAGNOSIS — M1611 Unilateral primary osteoarthritis, right hip: Secondary | ICD-10-CM | POA: Diagnosis not present

## 2020-08-05 DIAGNOSIS — Z01818 Encounter for other preprocedural examination: Secondary | ICD-10-CM | POA: Diagnosis not present

## 2020-08-05 DIAGNOSIS — M25551 Pain in right hip: Secondary | ICD-10-CM | POA: Diagnosis not present

## 2020-08-18 ENCOUNTER — Ambulatory Visit: Payer: Medicare Other | Admitting: Physician Assistant

## 2020-08-18 ENCOUNTER — Encounter: Payer: Self-pay | Admitting: Physician Assistant

## 2020-08-18 ENCOUNTER — Other Ambulatory Visit: Payer: Self-pay

## 2020-08-18 VITALS — BP 148/72 | HR 64 | Ht 69.0 in | Wt 181.0 lb

## 2020-08-18 DIAGNOSIS — Z01810 Encounter for preprocedural cardiovascular examination: Secondary | ICD-10-CM

## 2020-08-18 DIAGNOSIS — I10 Essential (primary) hypertension: Secondary | ICD-10-CM | POA: Diagnosis not present

## 2020-08-18 DIAGNOSIS — I739 Peripheral vascular disease, unspecified: Secondary | ICD-10-CM

## 2020-08-18 DIAGNOSIS — I2510 Atherosclerotic heart disease of native coronary artery without angina pectoris: Secondary | ICD-10-CM | POA: Diagnosis not present

## 2020-08-18 DIAGNOSIS — E785 Hyperlipidemia, unspecified: Secondary | ICD-10-CM

## 2020-08-18 NOTE — Progress Notes (Signed)
Cardiology Office Note:    Date:  08/20/2020   ID:  Jordan Chambers, DOB [birth year], MRN [id number]  PCP:  Deland Pretty, MD  [facility] Cardiologist:  Sanda Klein, MD  [place] Electrophysiologist:  None   Referring MD: Deland Pretty, MD   Chief Complaint  Patient presents with  . Follow-up    Pre-Op clearance prior to R hip surgery.    History of Present Illness:    Jordan Chambers is a 79 y.o. male with a hx of CAD, HTN, HLD, COPD, and PAD followed by Dr. Scot Dock.  Patient had abnormal treadmill stress test in 2000.  Subsequent cardiac catheterization revealed occlusive disease in one of the the coronary vessels that was treated with 3 stents.  He later underwent repeat cardiac catheterization with angioplasty for in-stent restenosis in 2008.  ABI in 04/02/2008 was abnormal with right ABI 0.57, left ABI 0.95.  Myoview in 2011 was normal.  He also had a normal ETT in Jan 2018.  Echocardiogram obtained on 09/16/2018 showed EF 60 to 65%, trivial MR.  Patient was last seen by Dr. Sallyanne Kuster in April 2021 at which time he was doing ok from cardiac perspective, he had mild exertional dyspnea and saw a pulmonary specialist.  Patient presents today for preoperative clearance prior to right hip surgery.  He denies any recent chest pain or shortness of breath.  He is able to climb up 2 flight of stairs or walk 2 blocks away from his home and back without any issue.  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  His EKG does show minimal ST depression in the inferior lead, he has similar ST depression on the previous EKG.  I reviewed his case and the EKG with his primary cardiologist Dr. Sallyanne Kuster who felt there is no significant change and that he is cleared to proceed with such surgery given lack of anginal symptom.    Past Medical History:  Diagnosis Date  . Bladder tumor   . CAD (coronary artery  disease) cardiologist-  dr croitoru    08/ 2000  Cardiac cath w/ stenting x3 (per pt. one vessel);  12/ 2008 cardiac cath w/ angioplasty for in-stent restenosis  . Congestion of throat 09-05-2017  received call back from pt today 1349 , pt had seen his pcp , was told just his sinuses   09-05-2017 per pt congestion in throat , cough, occasion productive yellow, no fever (advised pt to see his pcp prior to surgery 09-07-2017)  . COPD (chronic obstructive pulmonary disease) ([place])   . Diverticulosis of colon   . Full dentures   . Gross hematuria   . History of adenomatous polyp of colon    2007; 2012; 2015  . History of basal cell carcinoma (BCC) excision    05/ 2014  nose  . History of exercise stress test 09-08-2016   dr croitoru   no evidence of stress induced ischmeia;  per dr croitoru note did not show evidence of any new blockages, blood pressure did increase excessively w/ exercise (not unexpected since we stopped metoprolol for the test)  . Hyperlipidemia   . Hypertension   . Peripheral arterial occlusive disease [facility])    09/ 2009  dx right SFA occlusion by dx dr Scot Dock  . Pulmonary nodule    RLL in 2001  . S/P coronary artery stent placement 03/1999   one  vessel PCI and stents x3  . 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 Wears glasses     Past Surgical History:  Procedure Laterality Date  . APPENDECTOMY  1971  . CARDIOVASCULAR STRESS TEST  07-08-2010  dr croitoru   normal perfusion nuclear study w/ no evicende ischemia/  normal LV function and wall motion , ef 70%  . COLONOSCOPY  last one 04-02-2014  . CORONARY ANGIOPLASTY  12/ 2000   dr tysinger   angioplasty for in-stent restenosis  . CORONARY ANGIOPLASTY WITH STENT PLACEMENT  08/ 2000  dr tysinger   stenting x3  (per pt one vessel)  . CYSTOSCOPY W/ RETROGRADES Bilateral 09/07/2017   Procedure: CYSTOSCOPY WITH BILATERAL RETROGRADE PYELOGRAM;  Surgeon: [name], MD;  Location: [facility];  Service:  Urology;  Laterality: Bilateral;  . CYSTOSCOPY WITH URETHRAL DILATATION N/A 09/07/2017   Procedure: CYSTOSCOPY WITH URETHRAL DILATATION;  Surgeon: [name], MD;  Location: [facility];  Service: Urology;  Laterality: N/A;  . TONSILLECTOMY  1951  . TRANSURETHRAL RESECTION OF BLADDER TUMOR N/A 09/07/2017   Procedure: TRANSURETHRAL RESECTION OF BLADDER TUMOR  EPIRUBICIN(TURBT);  Surgeon: [name], MD;  Location: [facility];  Service: Urology;  Laterality: N/A;  . [facility]    Current Medications: Current Meds  Medication Sig  . amLODipine (NORVASC) 10 MG tablet Take 10 mg by mouth daily.   Marland Kitchen aspirin EC 81 MG tablet Take 81 mg by mouth daily.  . irbesartan (AVAPRO) 300 MG tablet Take 1 tablet (300 mg total) by mouth every morning.  . metoprolol succinate (TOPROL-XL) 50 MG 24 hr tablet Take 1 tablet (50 mg total) by mouth daily after supper.  . rosuvastatin (CRESTOR) 10 MG tablet Take 10 mg by mouth every evening.      Allergies:   Patient has no known allergies.   Social History   Socioeconomic History  . Marital status: Married    Spouse name: Not on file  . Number of children: Not on file  . Years of education: Not on file  . Highest education level: Not on file  Occupational History  . Not on file  Tobacco Use  . Smoking status: Former Smoker    Packs/day: 1.00    Years: 40.00    Pack years: 40.00    Types: Cigarettes    Quit date: 09/06/2011    Years since quitting: 8.9  . Smokeless tobacco: Never Used  Vaping Use  . Vaping Use: Never used  Substance and Sexual Activity  . Alcohol use: No  . Drug use: No  . Sexual activity: Not on file  Other Topics Concern  . Not on file  Social History Narrative  . Not on file   Social Determinants of Health   Financial Resource Strain: Not on file  Food Insecurity: Not on file  Transportation Needs: Not on file  Physical Activity: Not on file  Stress: Not on file   Social Connections: Not on file     Family History: The patient's He was adopted. Family history is unknown by patient.  ROS:   Please see the history of present illness.     All other systems reviewed and are negative.  EKGs/Labs/Other Studies Reviewed:    The following studies were reviewed today:  Echo 09/16/2018 LV EF: 60% -  65%  Study Conclusions   - Procedure narrative: Transthoracic echocardiography. Image  quality was poor. The study  was technically difficult, as a  result of poor acoustic windows and poor sound wave transmission.  Intravenous contrast (Definity) was administered.  - Left ventricle: The cavity size was normal. Wall thickness was  normal. Systolic function was normal. The estimated ejection  fraction was in the range of 60% to 65%. Doppler parameters are  consistent with abnormal left ventricular relaxation (grade 1  diastolic dysfunction). The E/e&' ratio is <8, suggesting normal  LV filling pressure.  - Mitral valve: Mildly thickened leaflets . There was trivial  regurgitation.  - Left atrium: The atrium was normal in size.  - Right atrium: The atrium was at the upper limits of normal in  size.  - Systemic veins: The IVC measures <2.1 cm, but does not collapse  >50%, suggesting an elevated RA pressure of 8 mmHg.   Impressions:   - Technically difficult study. Definity contrast given. LVEF  60-65%, normal wall thickness, grossly normal wall motion, grade  1 DD, normal LV filling pressure, trivial MR, normal LA size,  upper normal RA size, IVC suggests elevated RA pressure of 8  mmHg.   EKG:  EKG is ordered today.  The ekg ordered today demonstrates sinus rhythm with minimal ST depression in the inferior leads.  Recent Labs: No results found for requested labs within last 8760 hours.  Recent Lipid Panel    Component Value Date/Time   CHOL 94 09/16/2018 0214   TRIG 32 09/16/2018 0214   HDL 47 09/16/2018 0214    CHOLHDL 2.0 09/16/2018 0214   VLDL 6 09/16/2018 0214   LDLCALC 41 09/16/2018 0214     Risk Assessment/Calculations:       Physical Exam:    VS:  BP (!) 148/72 (BP Location: Left Arm, Patient Position: Sitting, Cuff Size: Normal)   Pulse 64   Ht 5\' 9"  (1.753 m)   Wt 181 lb (82.1 kg)   BMI 26.73 kg/m     Wt Readings from Last 3 Encounters:  08/18/20 181 lb (82.1 kg)  12/11/19 176 lb (79.8 kg)  09/16/18 182 lb 8 oz (82.8 kg)     GEN:  Well nourished, well developed in no acute distress HEENT: Normal NECK: No JVD; No carotid bruits LYMPHATICS: No lymphadenopathy CARDIAC: RRR, no murmurs, rubs, gallops RESPIRATORY:  Clear to auscultation without rales, wheezing or rhonchi  ABDOMEN: Soft, non-tender, non-distended MUSCULOSKELETAL:  No edema; No deformity  SKIN: Warm and dry NEUROLOGIC:  Alert and oriented x 3 PSYCHIATRIC:  Normal affect   ASSESSMENT:    1. Pre-operative cardiovascular examination   2. Coronary artery disease involving native coronary artery of native heart without angina pectoris   3. Essential hypertension   4. Hyperlipidemia LDL goal <70   5. PAD (peripheral artery disease) (HCC)    PLAN:    In order of problems listed above:  1. Preoperative clearance:  -Patient has upcoming right hip surgery by Dr. Len Childs in February 2022.  -He denies any recent chest discomfort. EKG continue to show minimal ST depression in the inferior lead, however this is unchanged when compared to the previous EKG. I have personally reviewed his case and EKG with Dr. Sallyanne Kuster, his primary cardiologist. We feel the patient is at acceptable risk to proceed with surgery.  -He may hold aspirin for 5 to 7 days prior to the procedure, although it is more ideal to continue on the aspirin through the procedure if possible.  2. CAD: Denies any recent chest pain  3. Hypertension: Blood pressure stable  4. Hyperlipidemia: On Crestor  5. PAD: History of abnormal ABI with  peripheral arterial disease, however patient does not have any claudication symptoms.        Medication Adjustments/Labs and Tests Ordered: Current medicines are reviewed at length with the patient today.  Concerns regarding medicines are outlined above.  Orders Placed This Encounter  Procedures  . EKG 12-Lead   No orders of the defined types were placed in this encounter.   Patient Instructions  Medication Instructions:  Your physician recommends that you continue on your current medications as directed. Please refer to the Current Medication list given to you today.  *If you need a refill on your cardiac medications before your next appointment, please call your pharmacy*  Lab Work: NONE ordered at this time of appointment   If you have labs (blood work) drawn today and your tests are completely normal, you will receive your results only by: Marland Kitchen MyChart Message (if you have MyChart) OR . A paper copy in the mail If you have any lab test that is abnormal or we need to change your treatment, we will call you to review the results.  Testing/Procedures: NONE ordered at this time of appointment   Follow-Up: At [facility], you and your health needs are our priority.  As part of our continuing mission to provide you with exceptional heart care, we have created designated Provider Care Teams.  These Care Teams include your primary Cardiologist (physician) and Advanced Practice Providers (APPs -  Physician Assistants and Nurse Practitioners) who all work together to provide you with the care you need, when you need it.  Your next appointment:   6 month(s)  The format for your next appointment:   In Person  Provider:   Sanda Klein, MD  Other Instructions      Signed, Almyra Deforest, [place]  08/20/2020 5:31 PM    [place]

## 2020-08-18 NOTE — Progress Notes (Signed)
   Primary Cardiologist: Sanda Klein, MD  [name] (August 09, 1941) was seen in the cardiology office on 08/18/2020 at which time he was doing well without any chest pain or shortness of breath. He was able to accomplish at least 4 METS of activity without any issue. Previous treadmill test in 2018 was normal.  EKG today showed sinus rhythm with minimal ST depression, this was personally reviewed with Dr. Sallyanne Kuster, the patient's primary cardiologist.  Given lack of anginal symptom and the presence of the same changes seen on the previous EKG, Dr. Sallyanne Kuster felt the patient is at acceptable risk to proceed with right hip surgery.  It is ideal for him to continue on the aspirin through the surgery, however if absolutely needed he may hold aspirin for 5 to 7 days prior to the surgery and restart as soon as possible afterward.  [place], [place] 08/18/2020, 9:40 AM

## 2020-08-18 NOTE — Patient Instructions (Signed)

## 2020-08-20 ENCOUNTER — Encounter: Payer: Self-pay | Admitting: Physician Assistant

## 2020-09-21 DIAGNOSIS — Z8551 Personal history of malignant neoplasm of bladder: Secondary | ICD-10-CM | POA: Diagnosis not present

## 2020-09-29 ENCOUNTER — Ambulatory Visit: Payer: Medicare Other | Admitting: General Practice

## 2020-10-01 DIAGNOSIS — I10 Essential (primary) hypertension: Secondary | ICD-10-CM | POA: Diagnosis not present

## 2020-10-08 DIAGNOSIS — Z0001 Encounter for general adult medical examination with abnormal findings: Secondary | ICD-10-CM | POA: Diagnosis not present

## 2020-10-08 DIAGNOSIS — I10 Essential (primary) hypertension: Secondary | ICD-10-CM | POA: Diagnosis not present

## 2020-10-08 DIAGNOSIS — R0981 Nasal congestion: Secondary | ICD-10-CM | POA: Diagnosis not present

## 2020-10-08 DIAGNOSIS — R3129 Other microscopic hematuria: Secondary | ICD-10-CM | POA: Diagnosis not present

## 2020-10-12 DIAGNOSIS — Z01818 Encounter for other preprocedural examination: Secondary | ICD-10-CM | POA: Diagnosis not present

## 2020-10-12 DIAGNOSIS — R911 Solitary pulmonary nodule: Secondary | ICD-10-CM | POA: Diagnosis not present

## 2020-10-12 DIAGNOSIS — R001 Bradycardia, unspecified: Secondary | ICD-10-CM | POA: Diagnosis not present

## 2020-10-12 DIAGNOSIS — M1611 Unilateral primary osteoarthritis, right hip: Secondary | ICD-10-CM | POA: Diagnosis not present

## 2020-10-21 DIAGNOSIS — Z9049 Acquired absence of other specified parts of digestive tract: Secondary | ICD-10-CM | POA: Diagnosis not present

## 2020-10-21 DIAGNOSIS — I10 Essential (primary) hypertension: Secondary | ICD-10-CM | POA: Diagnosis not present

## 2020-10-21 DIAGNOSIS — Z8551 Personal history of malignant neoplasm of bladder: Secondary | ICD-10-CM | POA: Diagnosis not present

## 2020-10-21 DIAGNOSIS — M1611 Unilateral primary osteoarthritis, right hip: Secondary | ICD-10-CM | POA: Diagnosis not present

## 2020-10-21 DIAGNOSIS — Z79899 Other long term (current) drug therapy: Secondary | ICD-10-CM | POA: Diagnosis not present

## 2020-10-21 DIAGNOSIS — Z7982 Long term (current) use of aspirin: Secondary | ICD-10-CM | POA: Diagnosis not present

## 2020-10-21 DIAGNOSIS — Z87891 Personal history of nicotine dependence: Secondary | ICD-10-CM | POA: Diagnosis not present

## 2020-10-21 DIAGNOSIS — I2510 Atherosclerotic heart disease of native coronary artery without angina pectoris: Secondary | ICD-10-CM | POA: Diagnosis not present

## 2020-10-21 DIAGNOSIS — Z885 Allergy status to narcotic agent status: Secondary | ICD-10-CM | POA: Diagnosis not present

## 2020-10-21 DIAGNOSIS — Z9181 History of falling: Secondary | ICD-10-CM | POA: Diagnosis not present

## 2020-10-21 DIAGNOSIS — J449 Chronic obstructive pulmonary disease, unspecified: Secondary | ICD-10-CM | POA: Diagnosis not present

## 2020-10-21 DIAGNOSIS — E785 Hyperlipidemia, unspecified: Secondary | ICD-10-CM | POA: Diagnosis not present

## 2020-10-22 DIAGNOSIS — E785 Hyperlipidemia, unspecified: Secondary | ICD-10-CM | POA: Diagnosis not present

## 2020-10-22 DIAGNOSIS — Z7982 Long term (current) use of aspirin: Secondary | ICD-10-CM | POA: Diagnosis not present

## 2020-10-22 DIAGNOSIS — Z9049 Acquired absence of other specified parts of digestive tract: Secondary | ICD-10-CM | POA: Diagnosis not present

## 2020-10-22 DIAGNOSIS — Z9181 History of falling: Secondary | ICD-10-CM | POA: Diagnosis not present

## 2020-10-22 DIAGNOSIS — Z885 Allergy status to narcotic agent status: Secondary | ICD-10-CM | POA: Diagnosis not present

## 2020-10-22 DIAGNOSIS — I10 Essential (primary) hypertension: Secondary | ICD-10-CM | POA: Diagnosis not present

## 2020-10-22 DIAGNOSIS — Z8551 Personal history of malignant neoplasm of bladder: Secondary | ICD-10-CM | POA: Diagnosis not present

## 2020-10-22 DIAGNOSIS — Z79899 Other long term (current) drug therapy: Secondary | ICD-10-CM | POA: Diagnosis not present

## 2020-10-22 DIAGNOSIS — M1611 Unilateral primary osteoarthritis, right hip: Secondary | ICD-10-CM | POA: Diagnosis not present

## 2020-10-22 DIAGNOSIS — I2510 Atherosclerotic heart disease of native coronary artery without angina pectoris: Secondary | ICD-10-CM | POA: Diagnosis not present

## 2020-10-22 DIAGNOSIS — Z87891 Personal history of nicotine dependence: Secondary | ICD-10-CM | POA: Diagnosis not present

## 2020-10-22 DIAGNOSIS — J449 Chronic obstructive pulmonary disease, unspecified: Secondary | ICD-10-CM | POA: Diagnosis not present

## 2020-10-24 DIAGNOSIS — Z8551 Personal history of malignant neoplasm of bladder: Secondary | ICD-10-CM | POA: Diagnosis not present

## 2020-10-24 DIAGNOSIS — Z87891 Personal history of nicotine dependence: Secondary | ICD-10-CM | POA: Diagnosis not present

## 2020-10-24 DIAGNOSIS — Z96641 Presence of right artificial hip joint: Secondary | ICD-10-CM | POA: Diagnosis not present

## 2020-10-24 DIAGNOSIS — Z9181 History of falling: Secondary | ICD-10-CM | POA: Diagnosis not present

## 2020-10-24 DIAGNOSIS — Z471 Aftercare following joint replacement surgery: Secondary | ICD-10-CM | POA: Diagnosis not present

## 2020-10-24 DIAGNOSIS — Z7982 Long term (current) use of aspirin: Secondary | ICD-10-CM | POA: Diagnosis not present

## 2020-10-24 DIAGNOSIS — I10 Essential (primary) hypertension: Secondary | ICD-10-CM | POA: Diagnosis not present

## 2020-11-03 DIAGNOSIS — Z471 Aftercare following joint replacement surgery: Secondary | ICD-10-CM | POA: Diagnosis not present

## 2020-11-03 DIAGNOSIS — Z96641 Presence of right artificial hip joint: Secondary | ICD-10-CM | POA: Diagnosis not present

## 2020-11-11 DIAGNOSIS — Z7409 Other reduced mobility: Secondary | ICD-10-CM | POA: Diagnosis not present

## 2020-11-11 DIAGNOSIS — R29898 Other symptoms and signs involving the musculoskeletal system: Secondary | ICD-10-CM | POA: Diagnosis not present

## 2020-11-11 DIAGNOSIS — M25651 Stiffness of right hip, not elsewhere classified: Secondary | ICD-10-CM | POA: Diagnosis not present

## 2020-11-11 DIAGNOSIS — Z96641 Presence of right artificial hip joint: Secondary | ICD-10-CM | POA: Diagnosis not present

## 2020-11-19 DIAGNOSIS — R29898 Other symptoms and signs involving the musculoskeletal system: Secondary | ICD-10-CM | POA: Diagnosis not present

## 2020-11-19 DIAGNOSIS — M25651 Stiffness of right hip, not elsewhere classified: Secondary | ICD-10-CM | POA: Diagnosis not present

## 2020-11-19 DIAGNOSIS — Z96641 Presence of right artificial hip joint: Secondary | ICD-10-CM | POA: Diagnosis not present

## 2020-11-19 DIAGNOSIS — Z7409 Other reduced mobility: Secondary | ICD-10-CM | POA: Diagnosis not present

## 2020-11-26 DIAGNOSIS — R29898 Other symptoms and signs involving the musculoskeletal system: Secondary | ICD-10-CM | POA: Diagnosis not present

## 2020-11-26 DIAGNOSIS — Z96641 Presence of right artificial hip joint: Secondary | ICD-10-CM | POA: Diagnosis not present

## 2020-11-26 DIAGNOSIS — Z7409 Other reduced mobility: Secondary | ICD-10-CM | POA: Diagnosis not present

## 2020-11-26 DIAGNOSIS — M25651 Stiffness of right hip, not elsewhere classified: Secondary | ICD-10-CM | POA: Diagnosis not present

## 2020-12-03 DIAGNOSIS — Z96641 Presence of right artificial hip joint: Secondary | ICD-10-CM | POA: Diagnosis not present

## 2020-12-03 DIAGNOSIS — R29898 Other symptoms and signs involving the musculoskeletal system: Secondary | ICD-10-CM | POA: Diagnosis not present

## 2020-12-03 DIAGNOSIS — M25651 Stiffness of right hip, not elsewhere classified: Secondary | ICD-10-CM | POA: Diagnosis not present

## 2020-12-03 DIAGNOSIS — Z7409 Other reduced mobility: Secondary | ICD-10-CM | POA: Diagnosis not present

## 2020-12-21 ENCOUNTER — Other Ambulatory Visit: Payer: Self-pay

## 2020-12-21 MED ORDER — IRBESARTAN 300 MG PO TABS: 300.0000 mg | ORAL_TABLET | Freq: Every morning | ORAL | 2 refills | Status: DC

## 2021-02-04 ENCOUNTER — Ambulatory Visit: Payer: Medicare Other | Admitting: Cardiovascular Disease

## 2021-02-04 ENCOUNTER — Other Ambulatory Visit: Payer: Self-pay

## 2021-02-04 VITALS — BP 134/72 | HR 63 | Ht 69.0 in | Wt 181.0 lb

## 2021-02-04 DIAGNOSIS — I10 Essential (primary) hypertension: Secondary | ICD-10-CM

## 2021-02-04 DIAGNOSIS — I739 Peripheral vascular disease, unspecified: Secondary | ICD-10-CM

## 2021-02-04 DIAGNOSIS — E7800 Pure hypercholesterolemia, unspecified: Secondary | ICD-10-CM | POA: Diagnosis not present

## 2021-02-04 DIAGNOSIS — I2510 Atherosclerotic heart disease of native coronary artery without angina pectoris: Secondary | ICD-10-CM | POA: Diagnosis not present

## 2021-02-04 NOTE — Patient Instructions (Signed)

## 2021-02-04 NOTE — Progress Notes (Signed)
Cardiology Office Note    Date:  02/05/2021   ID:  Jordan Chambers, DOB [birth year], MRN [id number]  PCP:  Jordan Pretty, MD  Cardiologist:   Sanda Klein, MD   No chief complaint on file.   History of Present Illness:  Jordan Chambers is a 80 y.o. male with a long-standing history of coronary artery disease, detected by treadmill stress testing 2000, treated with placement of 3 stents and a subsequent angioplasty for in-stent restenosis, but without any recurrent events in the last 20 years.  He has hypertension and hypercholesterolemia.  He had a normal nuclear stress test in 2011 and a normal ECG treadmill stress test in 2018.  He continues to do quite well.  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  He has mild exertional dyspnea.  He can climb a flight of stairs and work in the yard with little restriction.  He stopped smoking about 10 or 11 years ago.  Since his last appointment he has not had any problems with lower extremity edema or claudication.  He denies orthopnea or PND and does not have chest pain either at rest or with activity.  He has not had palpitations dizziness or syncope or any strokelike symptoms.  As in the past, he has had some intermittent swelling and discomfort in his left knee and left calf, but previous ultrasound has not shown evidence of DVT.  His blood pressure is well controlled.  His most recent hemoglobin A1c was borderline at 6.0%.  His most recent LDL was excellent at 49.  He is compliant with the statin.  In 2009 lower extremity arterial Dopplers showed a right ABI of 0.57 (obstruction at the level of the thigh with monophasic popliteal and tibial waveforms) with a normal left ABI of 0.95.  He does not complain of claudication.    Past Medical History:  Diagnosis Date   Bladder tumor    CAD (coronary artery disease) cardiologist-  dr Tzivia Oneil    08/ 2000  Cardiac cath w/ stenting x3 (per pt. one  vessel);  12/ 2008 cardiac cath w/ angioplasty for in-stent restenosis   Congestion of throat 09-05-2017  received call back from pt today 1349 , pt had seen his pcp , was told just his sinuses   09-05-2017 per pt congestion in throat , cough, occasion productive yellow, no fever (advised pt to see his pcp prior to surgery 09-07-2017)   COPD (chronic obstructive pulmonary disease) ([place])    Diverticulosis of colon    Full dentures    Gross hematuria    History of adenomatous polyp of colon    2007; 2012; 2015   History of basal cell carcinoma (BCC) excision    05/ 2014  nose   History of exercise stress test 09-08-2016   dr Breckyn Troyer   no evidence of stress induced ischmeia;  per dr Lestine Rahe note did not show evidence of any new blockages, blood pressure did increase excessively w/ exercise (not unexpected since we stopped metoprolol for the test)   Hyperlipidemia    Hypertension    Peripheral arterial occlusive disease ([place])    09/ 2009  dx right SFA occlusion by dx dr Scot Dock   Pulmonary nodule    RLL in 2001   S/P coronary artery stent placement 03/1999   one vessel PCI and stents x3   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    Wears glasses  Past Surgical History:  Procedure Laterality Date   APPENDECTOMY  1971   CARDIOVASCULAR STRESS TEST  07-08-2010  dr Emily Forse   normal perfusion nuclear study w/ no evicende ischemia/  normal LV function and wall motion , ef 70%   COLONOSCOPY  last one 04-02-2014   CORONARY ANGIOPLASTY  12/ 2000   dr tysinger   angioplasty for in-stent restenosis   CORONARY ANGIOPLASTY WITH STENT PLACEMENT  08/ 2000  dr tysinger   stenting x3  (per pt one vessel)   CYSTOSCOPY W/ RETROGRADES Bilateral 09/07/2017   Procedure: CYSTOSCOPY WITH BILATERAL RETROGRADE PYELOGRAM;  Surgeon: [name], MD;  Location: [facility];  Service: Urology;  Laterality: Bilateral;   CYSTOSCOPY WITH URETHRAL DILATATION N/A 09/07/2017   Procedure: CYSTOSCOPY WITH  URETHRAL DILATATION;  Surgeon: [name], MD;  Location: [facility];  Service: Urology;  Laterality: N/A;   TONSILLECTOMY  1951   TRANSURETHRAL RESECTION OF BLADDER TUMOR N/A 09/07/2017   Procedure: TRANSURETHRAL RESECTION OF BLADDER TUMOR  EPIRUBICIN(TURBT);  Surgeon: [name], MD;  Location: [facility];  Service: Urology;  Laterality: N/A;   [facility]    Current Medications: Outpatient Medications Prior to Visit  Medication Sig Dispense Refill   amLODipine (NORVASC) 10 MG tablet Take 10 mg by mouth daily.   0   aspirin EC 81 MG tablet Take 81 mg by mouth daily.     Cetirizine HCl (ZYRTEC ALLERGY) 10 MG TBDP 1 tablet     irbesartan (AVAPRO) 300 MG tablet Take 1 tablet (300 mg total) by mouth every morning. 90 tablet 2   metoprolol succinate (TOPROL-XL) 50 MG 24 hr tablet Take 1 tablet (50 mg total) by mouth daily after supper. 30 tablet 11   rosuvastatin (CRESTOR) 10 MG tablet Take 10 mg by mouth every evening.   0   No facility-administered medications prior to visit.     Allergies:   Patient has no known allergies.   Social History   Socioeconomic History   Marital status: Married    Spouse name: Not on file   Number of children: Not on file   Years of education: Not on file   Highest education level: Not on file  Occupational History   Not on file  Tobacco Use   Smoking status: Former    Packs/day: 1.00    Years: 40.00    Pack years: 40.00    Types: Cigarettes    Quit date: 09/06/2011    Years since quitting: 9.4   Smokeless tobacco: Never  Vaping Use   Vaping Use: Never used  Substance and Sexual Activity   Alcohol use: No   Drug use: No   Sexual activity: Not on file  Other Topics Concern   Not on file  Social History Narrative   Not on file   Social Determinants of Health   Financial Resource Strain: Not on file  Food Insecurity: Not on file  Transportation Needs: Not on file  Physical  Activity: Not on file  Stress: Not on file  Social Connections: Not on file     Family History:  The patient's He was adopted. Family history is unknown by patient.   ROS:   Please see the history of present illness.    ROS All other systems reviewed and are negative.   PHYSICAL EXAM:   VS:  BP 134/72   Pulse 63   Ht 5\' 9"  (1.753 m)  Wt 181 lb (82.1 kg)   SpO2 91%   BMI 26.73 kg/m     General: Alert, oriented x3, no distress, appears well and full of energy Head: no evidence of trauma, PERRL, EOMI, no exophtalmos or lid lag, no myxedema, no xanthelasma; normal ears, nose and oropharynx Neck: normal jugular venous pulsations and no hepatojugular reflux; brisk carotid pulses without delay and no carotid bruits Chest: clear to auscultation, no signs of consolidation by percussion or palpation, normal fremitus, symmetrical and full respiratory excursions Cardiovascular: normal position and quality of the apical impulse, regular rhythm, normal first and second heart sounds, no murmurs, rubs or gallops Abdomen: no tenderness or distention, no masses by palpation, no abnormal pulsatility or arterial bruits, normal bowel sounds, no hepatosplenomegaly Extremities: no clubbing, cyanosis or edema; unable to palpate any pulses in the right foot, but the skin is warm and pink with good capillary refill. Neurological: grossly nonfocal Psych: Normal mood and affect   Wt Readings from Last 3 Encounters:  02/04/21 181 lb (82.1 kg)  08/18/20 181 lb (82.1 kg)  12/11/19 176 lb (79.8 kg)      Studies/Labs Reviewed:   EKG:  EKG is ordered today.  Personally reviewed, the tracing shows normal sinus rhythm and overall normal findings.   Lipid Panel 2018 Total cholesterol 119, triglycerides 117, HDL 46, LDL 50, A1c 6.2%  09/27/2019 Total cholesterol 110, triglycerides 74, HDL 56, LDL 39 Normal liver function test except borderline alkaline phosphatase 127, including GGT 19.  Calcium  9.2 Potassium 5.0, creatinine 0.83, glucose 90 Hemoglobin 14.4  10/21/2020 Cholesterol 121, triglycerides 74, HDL 57, LDL 49 Hemoglobin A1c 6.0% BUN 20, creatinine 0.82 Additional studies/ records that were reviewed today include:    ASSESSMENT:    1. Coronary artery disease involving native coronary artery of native heart without angina pectoris   2. PAD (peripheral artery disease) ([place])   3. Hypercholesteremia   4. Essential hypertension      PLAN:  In order of problems listed above:  CAD: Asymptomatic, relatively recent functional study was low risk.  No change since reduce his dose of metoprolol due to bradycardia.   PAD: Longstanding total occlusion of the right superficial femoral artery which is asymptomatic.  Despite very low ABI, no claudication.  No signs of threatened limb ischemia. HLP: All lipid parameters in target range on the current medication.  Continue rosuvastatin. HTN: Well-controlled.   Medication Adjustments/Labs and Tests Ordered: Current medicines are reviewed at length with the patient today.  Concerns regarding medicines are outlined above.  Medication changes, Labs and Tests ordered today are listed in the Patient Instructions below. Patient Instructions  Medication Instructions:  No changes *If you need a refill on your cardiac medications before your next appointment, please call your pharmacy*   Lab Work: None ordered If you have labs (blood work) drawn today and your tests are completely normal, you will receive your results only by: [place] (if you have MyChart) OR A paper copy in the mail If you have any lab test that is abnormal or we need to change your treatment, we will call you to review the results.   Testing/Procedures: None ordered   Follow-Up: At [facility], you and your health needs are our priority.  As part of our continuing mission to provide you with exceptional heart care, we have created designated  Provider Care Teams.  These Care Teams include your primary Cardiologist (physician) and Advanced Practice Providers (APPs -  Physician Assistants and  Nurse Practitioners) who all work together to provide you with the care you need, when you need it.  We recommend signing up for the patient portal called "MyChart".  Sign up information is provided on this After Visit Summary.  MyChart is used to connect with patients for Virtual Visits (Telemedicine).  Patients are able to view lab/test results, encounter notes, upcoming appointments, etc.  Non-urgent messages can be sent to your provider as well.   To learn more about what you can do with MyChart, go to [email or web address].    Your next appointment:   12 month(s)  The format for your next appointment:   In Person  Provider:   You may see Sanda Klein, MD or one of the following Advanced Practice Providers on your designated Care Team:   Almyra Deforest, PA-C Fabian Sharp, [place] or  Roby Lofts, PA-C    Signed, Sanda Klein, MD  02/05/2021 2:40 PM    [facility] [address], [place], [place]  [address] Phone: [phone]; Fax: [phone]

## 2021-02-05 ENCOUNTER — Encounter: Payer: Self-pay | Admitting: Cardiovascular Disease

## 2021-03-04 ENCOUNTER — Other Ambulatory Visit: Payer: Self-pay | Admitting: Cardiovascular Disease

## 2021-10-02 ENCOUNTER — Other Ambulatory Visit: Payer: Self-pay | Admitting: Cardiovascular Disease

## 2021-10-04 DIAGNOSIS — Z8551 Personal history of malignant neoplasm of bladder: Secondary | ICD-10-CM | POA: Diagnosis not present

## 2021-10-04 DIAGNOSIS — N35011 Post-traumatic bulbous urethral stricture: Secondary | ICD-10-CM | POA: Diagnosis not present

## 2021-10-04 DIAGNOSIS — Z96641 Presence of right artificial hip joint: Secondary | ICD-10-CM | POA: Diagnosis not present

## 2021-10-07 DIAGNOSIS — R7309 Other abnormal glucose: Secondary | ICD-10-CM | POA: Diagnosis not present

## 2021-10-07 DIAGNOSIS — I10 Essential (primary) hypertension: Secondary | ICD-10-CM | POA: Diagnosis not present

## 2021-10-07 DIAGNOSIS — E7801 Familial hypercholesterolemia: Secondary | ICD-10-CM | POA: Diagnosis not present

## 2021-10-07 DIAGNOSIS — Z125 Encounter for screening for malignant neoplasm of prostate: Secondary | ICD-10-CM | POA: Diagnosis not present

## 2021-10-14 DIAGNOSIS — J449 Chronic obstructive pulmonary disease, unspecified: Secondary | ICD-10-CM | POA: Diagnosis not present

## 2021-10-14 DIAGNOSIS — D649 Anemia, unspecified: Secondary | ICD-10-CM | POA: Diagnosis not present

## 2021-10-14 DIAGNOSIS — I70209 Unspecified atherosclerosis of native arteries of extremities, unspecified extremity: Secondary | ICD-10-CM | POA: Diagnosis not present

## 2021-10-14 DIAGNOSIS — R7309 Other abnormal glucose: Secondary | ICD-10-CM | POA: Diagnosis not present

## 2021-10-14 DIAGNOSIS — I10 Essential (primary) hypertension: Secondary | ICD-10-CM | POA: Diagnosis not present

## 2021-10-14 DIAGNOSIS — E7801 Familial hypercholesterolemia: Secondary | ICD-10-CM | POA: Diagnosis not present

## 2021-10-14 DIAGNOSIS — Z0000 Encounter for general adult medical examination without abnormal findings: Secondary | ICD-10-CM | POA: Diagnosis not present

## 2021-10-14 DIAGNOSIS — I2510 Atherosclerotic heart disease of native coronary artery without angina pectoris: Secondary | ICD-10-CM | POA: Diagnosis not present

## 2021-10-19 DIAGNOSIS — H43822 Vitreomacular adhesion, left eye: Secondary | ICD-10-CM | POA: Diagnosis not present

## 2021-10-22 DIAGNOSIS — D649 Anemia, unspecified: Secondary | ICD-10-CM | POA: Diagnosis not present

## 2021-11-19 ENCOUNTER — Telehealth: Payer: Self-pay | Admitting: Hematology

## 2021-11-19 NOTE — Telephone Encounter (Signed)
Scheduled appt per 3/23 referral. Pt is aware of appt date and time. Pt is aware to arrive 15 mins prior to appt time and to bring and updated insurance card. Pt is aware of appt location.   ?

## 2022-01-17 ENCOUNTER — Emergency Department (HOSPITAL_BASED_OUTPATIENT_CLINIC_OR_DEPARTMENT_OTHER): Payer: Medicare Other

## 2022-01-17 ENCOUNTER — Observation Stay (HOSPITAL_COMMUNITY)
Admission: EM | Admit: 2022-01-17 | Discharge: 2022-01-18 | Disposition: A | Discharge status: Home / Self Care | Payer: Medicare Other | Attending: Internal Medicine | Admitting: Internal Medicine

## 2022-01-17 ENCOUNTER — Encounter (HOSPITAL_COMMUNITY): Payer: Self-pay

## 2022-01-17 ENCOUNTER — Emergency Department (HOSPITAL_BASED_OUTPATIENT_CLINIC_OR_DEPARTMENT_OTHER)
Admission: EM | Admit: 2022-01-17 | Discharge: 2022-01-17 | Discharge status: Against Medical Advice | Payer: Medicare Other | Source: Home / Self Care | Attending: Emergency Medicine | Admitting: Emergency Medicine

## 2022-01-17 ENCOUNTER — Encounter (HOSPITAL_BASED_OUTPATIENT_CLINIC_OR_DEPARTMENT_OTHER): Payer: Self-pay

## 2022-01-17 ENCOUNTER — Other Ambulatory Visit: Payer: Self-pay

## 2022-01-17 DIAGNOSIS — Z85828 Personal history of other malignant neoplasm of skin: Secondary | ICD-10-CM | POA: Insufficient documentation

## 2022-01-17 DIAGNOSIS — J209 Acute bronchitis, unspecified: Secondary | ICD-10-CM | POA: Diagnosis not present

## 2022-01-17 DIAGNOSIS — J449 Chronic obstructive pulmonary disease, unspecified: Secondary | ICD-10-CM | POA: Insufficient documentation

## 2022-01-17 DIAGNOSIS — I10 Essential (primary) hypertension: Secondary | ICD-10-CM | POA: Diagnosis not present

## 2022-01-17 DIAGNOSIS — Z20822 Contact with and (suspected) exposure to covid-19: Secondary | ICD-10-CM | POA: Insufficient documentation

## 2022-01-17 DIAGNOSIS — Z8619 Personal history of other infectious and parasitic diseases: Secondary | ICD-10-CM | POA: Insufficient documentation

## 2022-01-17 DIAGNOSIS — Z7952 Long term (current) use of systemic steroids: Secondary | ICD-10-CM | POA: Insufficient documentation

## 2022-01-17 DIAGNOSIS — R5383 Other fatigue: Secondary | ICD-10-CM | POA: Insufficient documentation

## 2022-01-17 DIAGNOSIS — Z8679 Personal history of other diseases of the circulatory system: Secondary | ICD-10-CM | POA: Insufficient documentation

## 2022-01-17 DIAGNOSIS — Z7902 Long term (current) use of antithrombotics/antiplatelets: Secondary | ICD-10-CM | POA: Diagnosis not present

## 2022-01-17 DIAGNOSIS — Z66 Do not resuscitate: Secondary | ICD-10-CM | POA: Diagnosis not present

## 2022-01-17 DIAGNOSIS — J9601 Acute respiratory failure with hypoxia: Principal | ICD-10-CM

## 2022-01-17 DIAGNOSIS — I2510 Atherosclerotic heart disease of native coronary artery without angina pectoris: Secondary | ICD-10-CM | POA: Diagnosis not present

## 2022-01-17 DIAGNOSIS — R911 Solitary pulmonary nodule: Secondary | ICD-10-CM | POA: Diagnosis not present

## 2022-01-17 DIAGNOSIS — R058 Other specified cough: Secondary | ICD-10-CM | POA: Diagnosis not present

## 2022-01-17 DIAGNOSIS — E7800 Pure hypercholesterolemia, unspecified: Secondary | ICD-10-CM | POA: Diagnosis not present

## 2022-01-17 DIAGNOSIS — D72829 Elevated white blood cell count, unspecified: Secondary | ICD-10-CM | POA: Diagnosis not present

## 2022-01-17 DIAGNOSIS — Z87891 Personal history of nicotine dependence: Secondary | ICD-10-CM | POA: Diagnosis not present

## 2022-01-17 DIAGNOSIS — Z79899 Other long term (current) drug therapy: Secondary | ICD-10-CM | POA: Insufficient documentation

## 2022-01-17 DIAGNOSIS — K5790 Diverticulosis of intestine, part unspecified, without perforation or abscess without bleeding: Secondary | ICD-10-CM | POA: Insufficient documentation

## 2022-01-17 DIAGNOSIS — J189 Pneumonia, unspecified organism: Secondary | ICD-10-CM | POA: Insufficient documentation

## 2022-01-17 DIAGNOSIS — J441 Chronic obstructive pulmonary disease with (acute) exacerbation: Secondary | ICD-10-CM

## 2022-01-17 DIAGNOSIS — J329 Chronic sinusitis, unspecified: Secondary | ICD-10-CM | POA: Insufficient documentation

## 2022-01-17 DIAGNOSIS — Z7982 Long term (current) use of aspirin: Secondary | ICD-10-CM | POA: Insufficient documentation

## 2022-01-17 DIAGNOSIS — R059 Cough, unspecified: Secondary | ICD-10-CM | POA: Diagnosis not present

## 2022-01-17 DIAGNOSIS — Z7951 Long term (current) use of inhaled steroids: Secondary | ICD-10-CM | POA: Diagnosis not present

## 2022-01-17 DIAGNOSIS — M109 Gout, unspecified: Secondary | ICD-10-CM | POA: Insufficient documentation

## 2022-01-17 DIAGNOSIS — Z91048 Other nonmedicinal substance allergy status: Secondary | ICD-10-CM | POA: Insufficient documentation

## 2022-01-17 DIAGNOSIS — R0602 Shortness of breath: Secondary | ICD-10-CM | POA: Diagnosis not present

## 2022-01-17 DIAGNOSIS — N35919 Unspecified urethral stricture, male, unspecified site: Secondary | ICD-10-CM | POA: Insufficient documentation

## 2022-01-17 DIAGNOSIS — M1990 Unspecified osteoarthritis, unspecified site: Secondary | ICD-10-CM | POA: Insufficient documentation

## 2022-01-17 DIAGNOSIS — Z955 Presence of coronary angioplasty implant and graft: Secondary | ICD-10-CM | POA: Insufficient documentation

## 2022-01-17 DIAGNOSIS — Z87448 Personal history of other diseases of urinary system: Secondary | ICD-10-CM | POA: Diagnosis not present

## 2022-01-17 DIAGNOSIS — I739 Peripheral vascular disease, unspecified: Secondary | ICD-10-CM | POA: Insufficient documentation

## 2022-01-17 DIAGNOSIS — J440 Chronic obstructive pulmonary disease with acute lower respiratory infection: Secondary | ICD-10-CM | POA: Diagnosis not present

## 2022-01-17 DIAGNOSIS — Z86010 Personal history of colonic polyps: Secondary | ICD-10-CM | POA: Insufficient documentation

## 2022-01-17 DIAGNOSIS — J310 Chronic rhinitis: Secondary | ICD-10-CM | POA: Insufficient documentation

## 2022-01-17 LAB — BASIC METABOLIC PANEL
Anion gap: 10 (ref 5–15)
BUN: 22 mg/dL (ref 8–23)
CO2: 28 mmol/L (ref 22–32)
Calcium: 9 mg/dL (ref 8.9–10.3)
Chloride: 101 mmol/L (ref 98–111)
Creatinine, Ser: 0.78 mg/dL (ref 0.61–1.24)
GFR, Estimated: >60 mL/min (ref >60)
Glucose, Bld: 100 mg/dL — ABNORMAL HIGH (ref 70–99)
Potassium: 4 mmol/L (ref 3.5–5.1)
Sodium: 139 mmol/L (ref 135–145)

## 2022-01-17 LAB — CBC WITH DIFFERENTIAL/PLATELET
Abs Immature Granulocytes: 0.06 10*3/uL (ref 0.00–0.07)
Basophils Absolute: 0 10*3/uL (ref 0.0–0.1)
Basophils Relative: 0 %
Eosinophils Absolute: 0.1 10*3/uL (ref 0.0–0.5)
Eosinophils Relative: 1 %
HCT: 46.1 % (ref 39.0–52.0)
Hemoglobin: 14.5 g/dL (ref 13.0–17.0)
Immature Granulocytes: 0 %
Lymphocytes Relative: 12 %
Lymphs Abs: 1.6 10*3/uL (ref 0.7–4.0)
MCH: 27.4 pg (ref 26.0–34.0)
MCHC: 31.5 g/dL (ref 30.0–36.0)
MCV: 87 fL (ref 80.0–100.0)
Monocytes Absolute: 1.2 10*3/uL — ABNORMAL HIGH (ref 0.1–1.0)
Monocytes Relative: 9 %
Neutro Abs: 10.5 10*3/uL — ABNORMAL HIGH (ref 1.7–7.7)
Neutrophils Relative %: 78 %
Platelets: 178 10*3/uL (ref 150–400)
RBC: 5.3 MIL/uL (ref 4.22–5.81)
RDW: 13.4 % (ref 11.5–15.5)
WBC: 13.6 10*3/uL — ABNORMAL HIGH (ref 4.0–10.5)
nRBC: 0 % (ref 0.0–0.2)

## 2022-01-17 LAB — RESP PANEL BY RT-PCR (FLU A&B, COVID) ARPGX2
Influenza A by PCR: NEGATIVE
Influenza B by PCR: NEGATIVE
SARS Coronavirus 2 by RT PCR: NEGATIVE

## 2022-01-17 LAB — MAGNESIUM: Magnesium: 2 mg/dL (ref 1.7–2.4)

## 2022-01-17 LAB — PHOSPHORUS: Phosphorus: 3.7 mg/dL (ref 2.5–4.6)

## 2022-01-17 MED ORDER — ALBUTEROL SULFATE (2.5 MG/3ML) 0.083% IN NEBU: 2.5000 mg | INHALATION_SOLUTION | Freq: Four times a day (QID) | RESPIRATORY_TRACT | Status: DC

## 2022-01-17 MED ORDER — IPRATROPIUM-ALBUTEROL 0.5-2.5 (3) MG/3ML IN SOLN
3.0000 mL | Freq: Three times a day (TID) | RESPIRATORY_TRACT | Status: DC
  Administered 2022-01-17 – 2022-01-18 (×2): 3 mL via RESPIRATORY_TRACT
  Filled 2022-01-17 (×3): qty 3

## 2022-01-17 MED ORDER — IPRATROPIUM BROMIDE 0.02 % IN SOLN: 0.5000 mg | Freq: Four times a day (QID) | RESPIRATORY_TRACT | Status: DC

## 2022-01-17 MED ORDER — ASPIRIN 81 MG PO TBEC
81.0000 mg | DELAYED_RELEASE_TABLET | Freq: Every day | ORAL | Status: DC
  Administered 2022-01-18: 81 mg via ORAL
  Filled 2022-01-17: qty 1

## 2022-01-17 MED ORDER — ROSUVASTATIN CALCIUM 10 MG PO TABS
10.0000 mg | ORAL_TABLET | Freq: Every evening | ORAL | Status: DC
  Administered 2022-01-17: 10 mg via ORAL
  Filled 2022-01-17 (×2): qty 1

## 2022-01-17 MED ORDER — PREDNISONE 20 MG PO TABS: 20.0000 mg | ORAL_TABLET | Freq: Two times a day (BID) | ORAL | 0 refills | Status: DC

## 2022-01-17 MED ORDER — PREDNISONE 20 MG PO TABS
40.0000 mg | ORAL_TABLET | Freq: Every day | ORAL | Status: DC
  Administered 2022-01-18: 40 mg via ORAL
  Filled 2022-01-17: qty 2

## 2022-01-17 MED ORDER — AMOXICILLIN-POT CLAVULANATE 875-125 MG PO TABS: 1.0000 | ORAL_TABLET | Freq: Two times a day (BID) | ORAL | 0 refills | Status: DC

## 2022-01-17 MED ORDER — IPRATROPIUM-ALBUTEROL 0.5-2.5 (3) MG/3ML IN SOLN: 3.0000 mL | Freq: Once | RESPIRATORY_TRACT | Status: AC

## 2022-01-17 MED ORDER — DEXAMETHASONE SODIUM PHOSPHATE 10 MG/ML IJ SOLN
10.0000 mg | Freq: Once | INTRAMUSCULAR | Status: AC
  Administered 2022-01-17: 10 mg via INTRAVENOUS
  Filled 2022-01-17: qty 1

## 2022-01-17 MED ORDER — ACETAMINOPHEN 650 MG RE SUPP: 650.0000 mg | Freq: Four times a day (QID) | RECTAL | Status: DC | PRN

## 2022-01-17 MED ORDER — IPRATROPIUM-ALBUTEROL 0.5-2.5 (3) MG/3ML IN SOLN
3.0000 mL | Freq: Once | RESPIRATORY_TRACT | Status: AC
  Administered 2022-01-17: 3 mL via RESPIRATORY_TRACT
  Filled 2022-01-17: qty 3

## 2022-01-17 MED ORDER — ONDANSETRON HCL 4 MG PO TABS: 4.0000 mg | ORAL_TABLET | Freq: Four times a day (QID) | ORAL | Status: DC | PRN

## 2022-01-17 MED ORDER — ALBUTEROL SULFATE (2.5 MG/3ML) 0.083% IN NEBU
10.0000 mg | INHALATION_SOLUTION | RESPIRATORY_TRACT | Status: DC
  Administered 2022-01-17: 10 mg via RESPIRATORY_TRACT
  Filled 2022-01-17: qty 12

## 2022-01-17 MED ORDER — ALBUTEROL SULFATE (2.5 MG/3ML) 0.083% IN NEBU: 2.5000 mg | INHALATION_SOLUTION | RESPIRATORY_TRACT | Status: DC | PRN

## 2022-01-17 MED ORDER — IPRATROPIUM-ALBUTEROL 0.5-2.5 (3) MG/3ML IN SOLN
3.0000 mL | Freq: Four times a day (QID) | RESPIRATORY_TRACT | Status: DC
  Administered 2022-01-17: 3 mL via RESPIRATORY_TRACT
  Filled 2022-01-17: qty 3

## 2022-01-17 MED ORDER — ONDANSETRON HCL 4 MG/2ML IJ SOLN: 4.0000 mg | Freq: Four times a day (QID) | INTRAMUSCULAR | Status: DC | PRN

## 2022-01-17 MED ORDER — AMOXICILLIN-POT CLAVULANATE 875-125 MG PO TABS
1.0000 | ORAL_TABLET | Freq: Two times a day (BID) | ORAL | Status: DC
  Administered 2022-01-17 – 2022-01-18 (×3): 1 via ORAL
  Filled 2022-01-17 (×3): qty 1

## 2022-01-17 MED ORDER — METOPROLOL SUCCINATE ER 50 MG PO TB24: 50.0000 mg | ORAL_TABLET | Freq: Every day | ORAL | Status: DC

## 2022-01-17 MED ORDER — IPRATROPIUM-ALBUTEROL 0.5-2.5 (3) MG/3ML IN SOLN
RESPIRATORY_TRACT | Status: AC
  Administered 2022-01-17: 3 mL via RESPIRATORY_TRACT
  Filled 2022-01-17: qty 3

## 2022-01-17 MED ORDER — METOPROLOL SUCCINATE ER 50 MG PO TB24
50.0000 mg | ORAL_TABLET | Freq: Every day | ORAL | Status: DC
Start: 2022-01-17 — End: 2022-01-18
  Administered 2022-01-17: 50 mg via ORAL
  Filled 2022-01-17 (×2): qty 1

## 2022-01-17 MED ORDER — DOXYCYCLINE HYCLATE 100 MG PO TABS
100.0000 mg | ORAL_TABLET | Freq: Once | ORAL | Status: AC
  Administered 2022-01-17: 100 mg via ORAL
  Filled 2022-01-17: qty 1

## 2022-01-17 MED ORDER — AMLODIPINE BESYLATE 10 MG PO TABS
10.0000 mg | ORAL_TABLET | Freq: Every day | ORAL | Status: DC
  Administered 2022-01-18: 10 mg via ORAL
  Filled 2022-01-17: qty 1

## 2022-01-17 MED ORDER — AEROCHAMBER PLUS FLO-VU MEDIUM MISC
1.0000 | Freq: Once | Status: AC
  Administered 2022-01-17: 1
  Filled 2022-01-17: qty 1

## 2022-01-17 MED ORDER — IRBESARTAN 300 MG PO TABS: 300.0000 mg | ORAL_TABLET | Freq: Every day | ORAL | Status: DC

## 2022-01-17 MED ORDER — MAGNESIUM SULFATE 2 GM/50ML IV SOLN: 2.0000 g | Freq: Once | INTRAVENOUS | Status: DC

## 2022-01-17 MED ORDER — AMOXICILLIN-POT CLAVULANATE 875-125 MG PO TABS
1.0000 | ORAL_TABLET | Freq: Once | ORAL | Status: AC
  Administered 2022-01-17: 1 via ORAL
  Filled 2022-01-17: qty 1

## 2022-01-17 MED ORDER — METOPROLOL SUCCINATE ER 50 MG PO TB24
50.0000 mg | ORAL_TABLET | Freq: Every day | ORAL | Status: DC
Start: 2022-01-17 — End: 2022-01-17

## 2022-01-17 MED ORDER — IRBESARTAN 300 MG PO TABS
300.0000 mg | ORAL_TABLET | Freq: Every day | ORAL | Status: DC
  Administered 2022-01-18: 300 mg via ORAL
  Filled 2022-01-17: qty 1

## 2022-01-17 MED ORDER — POTASSIUM CHLORIDE CRYS ER 20 MEQ PO TBCR: 20.0000 meq | EXTENDED_RELEASE_TABLET | Freq: Once | ORAL | Status: DC

## 2022-01-17 MED ORDER — GUAIFENESIN ER 600 MG PO TB12
600.0000 mg | ORAL_TABLET | Freq: Two times a day (BID) | ORAL | Status: DC
  Administered 2022-01-17 – 2022-01-18 (×3): 600 mg via ORAL
  Filled 2022-01-17 (×3): qty 1

## 2022-01-17 MED ORDER — ACETAMINOPHEN 325 MG PO TABS
650.0000 mg | ORAL_TABLET | Freq: Four times a day (QID) | ORAL | Status: DC | PRN
  Administered 2022-01-17 (×2): 650 mg via ORAL
  Filled 2022-01-17 (×2): qty 2

## 2022-01-17 MED ORDER — ENOXAPARIN SODIUM 40 MG/0.4ML IJ SOSY
40.0000 mg | PREFILLED_SYRINGE | INTRAMUSCULAR | Status: DC
  Filled 2022-01-17: qty 0.4

## 2022-01-17 NOTE — ED Triage Notes (Signed)
Arrives POV. C/o productive cough x 3 days. Thick brown sputum. Denies pain.

## 2022-01-17 NOTE — ED Notes (Signed)
Patient oxygen saturation 86% while ambulating in room. He denies SOB or chest pain while ambulating.

## 2022-01-17 NOTE — ED Notes (Signed)
RT note: Pt. given Spacer device for home Inhaler use.

## 2022-01-17 NOTE — Progress Notes (Signed)
Plan of Care Note for accepted transfer   Patient: Jordan Chambers MRN: [id number]   DOA: 01/17/2022  Facility requesting transfer: [facility]. Requesting Provider: Thamas Jaegers, MD. Reason for transfer: COPD exacerbation/hypoxia/new oxygen requirement. Facility course:  81 year old male with a past medical history of COPD, CAD, osteoarthritis, gout, hypertension, hyperlipidemia, peripheral arterial disease, [facility] spotted fever, basal cell carcinoma, former smoker who quit in 2013 with a 40-year pack smoking history who presented to the emergency department with complaints of productive cough, fatigue, wheezing and progressively worse dyspnea for the past 3 days.  Initially had an O2 sat of 90% on room air.  He received 2 DuoNebs, dexamethasone 10 mg IVP, doxycycline 100 mg p.o and Augmentin 875/125 mg p.o., but subsequently desaturated all the way down to 76% on room air while ambulating in the emergency department.  He is now requiring oxygen 2 LPM via [place] and receiving a continuous 10 mg albuterol nebulizer treatment.  His potassium level initially was 4.0 mmol/L.  I have ordered K-Dur 20 milliequivalents p.o. x1 dose and magnesium sulfate 2 g IVPB.  I have requested to add magnesium and phosphorus level to the current lab work.  Lab work:  Component Value Units  Resp Panel by RT-PCR (Flu A&B, Covid) Nasopharyngeal Swab [[id number]]   Collected: 01/17/22 0450   Updated: 01/17/22 0543   Specimen Type: Nasopharyngeal(NP) swabs in vial transport medium   Specimen Source: Nasopharyngeal Swab    SARS Coronavirus 2 by RT PCR NEGATIVE   Influenza A by PCR NEGATIVE   Influenza B by PCR NEGATIVE  Basic metabolic panel [[id number]] (Abnormal)   Collected: 01/17/22 0450   Updated: 01/17/22 0541   Specimen Type: Blood   Specimen Source: Vein    Sodium 139 mmol/L   Potassium 4.0 mmol/L   Chloride 101 mmol/L   CO2 28 mmol/L   Glucose, Bld 100 High  mg/dL   BUN 22 mg/dL   Creatinine, Ser 0.78 mg/dL    Calcium 9.0 mg/dL   GFR, Estimated >60 mL/min   Anion gap 10  CBC with Differential [[id number]] (Abnormal)   Collected: 01/17/22 0450   Updated: 01/17/22 0511   Specimen Type: Blood   Specimen Source: Vein    WBC 13.6 High  K/uL   RBC 5.30 MIL/uL   Hemoglobin 14.5 g/dL   HCT 46.1 %   MCV 87.0 fL   MCH 27.4 pg   MCHC 31.5 g/dL   RDW 13.4 %   Platelets 178 K/uL   nRBC 0.0 %   Neutrophils Relative % 78 %   Neutro Abs 10.5 High  K/uL   Lymphocytes Relative 12 %   Lymphs Abs 1.6 K/uL   Monocytes Relative 9 %   Monocytes Absolute 1.2 High  K/uL   Eosinophils Relative 1 %   Eosinophils Absolute 0.1 K/uL   Basophils Relative 0 %   Basophils Absolute 0.0 K/uL   Immature Granulocytes 0 %   Abs Immature Granulocytes 0.06 K/uL   Imaging: PORTABLE CHEST 1 VIEW   COMPARISON:  10/12/2020   FINDINGS: 0507 hours. Lungs are hyperexpanded. Right upper lobe pulmonary nodule is similar to exam from 07/26/2018 and was also visible on exam from 2011, consistent with benign etiology. Interstitial markings are diffusely coarsened with chronic features. The cardiopericardial silhouette is within normal limits for size. Telemetry leads overlie the chest.   IMPRESSION: Stable exam. Hyperexpansion with chronic interstitial coarsening. No acute cardiopulmonary findings.  Plan of care: The patient is accepted for admission to  Progressive unit, at [facility].  Author: Reubin Milan, MD 01/17/2022  Check [email or web address] for on-call coverage.  Nursing staff, Please call [place] number on Amion as soon as patient's arrival, so appropriate admitting provider can evaluate the pt.  This document was prepared using Dragon voice recognition software and may contain some unintended transcription errors.

## 2022-01-17 NOTE — ED Triage Notes (Signed)
Pt arrived via POV, states he has an admission bed here. Pt previously signed out AMA at [facility] ED stating he felt better and wanted to go home. Pt planned admission for COPD exacerbation. Pt states he would now like to be admitted.

## 2022-01-17 NOTE — ED Notes (Signed)
2L [place] applied to patient per MD request.

## 2022-01-17 NOTE — ED Provider Notes (Signed)
[facility] DEPT Provider Note   CSN: [id number] Arrival date & time: 01/17/22  1026     History  Chief Complaint  Patient presents with   Shortness of Breath    Jordan Chambers is a 81 y.o. male.  Patient is a 81 year old male with a history of hypertension, hyperlipidemia and COPD.  He has had a recent productive cough.  He was seen during the night last night/early morning hours at [facility].  He had a chest x-ray which did not show any acute abnormalities.  No evidence of pneumonia.  This was reviewed by me.  He was given multiple breathing treatments and had felt a bit better.  He was also given Decadron.  However he continued to have some hypoxia, particularly on ambulation and was requiring nasal cannula at 2 L/min.  He was recommended for admission and the physician there actually had already contacted Dr. Olevia Bowens with the hospitalist service but patient then decided he did not want admission and left AMA.  He comes back again today because he changed his mind.  He denies any worsening symptoms.  He says he just gets short of breath when he exerts himself.  On chart review, patient got multiple nebulizer treatments, Decadron, Augmentin and doxycycline in the ED early this morning.      Home Medications Prior to Admission medications   Medication Sig Start Date End Date Taking? Authorizing Provider  amLODipine (NORVASC) 10 MG tablet Take 10 mg by mouth daily.  06/28/16   [provider]  amoxicillin-clavulanate (AUGMENTIN) 875-125 MG tablet Take 1 tablet by mouth every 12 (twelve) hours. 01/17/22   Luna Fuse, MD  aspirin EC 81 MG tablet Take 81 mg by mouth daily.    [provider]  Cetirizine HCl (ZYRTEC ALLERGY) 10 MG TBDP 1 tablet    [provider]  irbesartan (AVAPRO) 300 MG tablet TAKE 1 TABLET(300 MG) BY MOUTH EVERY MORNING 10/04/21   Croitoru, Mihai, MD  metoprolol succinate (TOPROL-XL) 50 MG 24 hr  tablet TAKE 1 TABLET(50 MG) BY MOUTH DAILY AFTER SUPPER 03/05/21   Croitoru, Mihai, MD  predniSONE (DELTASONE) 20 MG tablet Take 1 tablet (20 mg total) by mouth 2 (two) times daily with a meal for 5 days. 01/17/22 01/22/22  Luna Fuse, MD  rosuvastatin (CRESTOR) 10 MG tablet Take 10 mg by mouth every evening.  06/28/16   [provider]      Allergies    Hydrochlorothiazide    Review of Systems   Review of Systems  Constitutional:  Positive for fatigue. Negative for chills, diaphoresis and fever.  HENT:  Negative for congestion, rhinorrhea and sneezing.   Eyes: Negative.   Respiratory:  Positive for cough, shortness of breath and wheezing. Negative for chest tightness.   Cardiovascular:  Negative for chest pain and leg swelling.  Gastrointestinal:  Negative for abdominal pain, blood in stool, diarrhea, nausea and vomiting.  Genitourinary:  Negative for difficulty urinating, flank pain, frequency and hematuria.  Musculoskeletal:  Negative for arthralgias and back pain.  Skin:  Negative for rash.  Neurological:  Negative for dizziness, speech difficulty, weakness, numbness and headaches.   Physical Exam Updated Vital Signs BP (!) 110/56   Pulse 71   Temp 97.7 F (36.5 C) (Oral)   Resp 19   Ht '5\' 9"'$  (1.753 m)   Wt 79.4 kg   SpO2 93%   BMI 25.84 kg/m  Physical Exam Constitutional:  Appearance: He is well-developed.  HENT:     Head: Normocephalic and atraumatic.  Eyes:     Pupils: Pupils are equal, round, and reactive to light.  Cardiovascular:     Rate and Rhythm: Normal rate and regular rhythm.     Heart sounds: Normal heart sounds.  Pulmonary:     Effort: Pulmonary effort is normal. No respiratory distress.     Breath sounds: Wheezing present. No rales.     Comments: Patient with some mild tachypnea.  He does have some mild wheezing on exam and some rhonchi.  Oxygen saturation is 92 to 93% at rest on room air. Chest:     Chest wall: No tenderness.   Abdominal:     General: Bowel sounds are normal.     Palpations: Abdomen is soft.     Tenderness: There is no abdominal tenderness. There is no guarding or rebound.  Musculoskeletal:        General: Normal range of motion.     Cervical back: Normal range of motion and neck supple.  Lymphadenopathy:     Cervical: No cervical adenopathy.  Skin:    General: Skin is warm and dry.     Findings: No rash.  Neurological:     Mental Status: He is alert and oriented to person, place, and time.    ED Results / Procedures / Treatments   Labs (all labs ordered are listed, but only abnormal results are displayed) Labs Reviewed - No data to display  EKG None  Radiology DG Chest Portable 1 View  Result Date: 01/17/2022 CLINICAL DATA:  Productive cough. EXAM: PORTABLE CHEST 1 VIEW COMPARISON:  10/12/2020 FINDINGS: 0507 hours. Lungs are hyperexpanded. Right upper lobe pulmonary nodule is similar to exam from 07/26/2018 and was also visible on exam from 2011, consistent with benign etiology. Interstitial markings are diffusely coarsened with chronic features. The cardiopericardial silhouette is within normal limits for size. Telemetry leads overlie the chest. IMPRESSION: Stable exam. Hyperexpansion with chronic interstitial coarsening. No acute cardiopulmonary findings. Electronically Signed   By: Misty Stanley M.D.   On: 01/17/2022 05:54    Procedures Procedures    Medications Ordered in ED Medications - No data to display  ED Course/ Medical Decision Making/ A&P                           Medical Decision Making Risk Decision regarding hospitalization.   Patient is a 81 year old male who presents with cough and wheezing with shortness of breath.  He does have some wheezing on arrival with some mild tachypnea but no increased work of breathing.  I did review his chest x-ray that was done earlier this morning and there is no evidence of pneumonia.  This was interpreted by me.  I reviewed  his labs that were done earlier this morning.  His WBC count is mildly elevated.  His COVID/flu test were negative.  We did ambulate him in the ED and his oxygen saturations dropped to 86% on ambulation.  He was placed on nasal cannula at 2 L/min.  His oxygen saturations at rest are about 92 to 93%.  He does not have other symptoms that would be more concerning for PE/ACS.  No evidence of pulmonary edema/CHF.  I spoke with Dr. Olevia Bowens he will admit the patient for further treatment.  Final Clinical Impression(s) / ED Diagnoses Final diagnoses:  COPD exacerbation ([place])    Rx / DC Orders ED Discharge  Orders     None         Malvin Johns, MD 01/17/22 1149

## 2022-01-17 NOTE — Discharge Instructions (Signed)
Return anytime if you change your mind or would like to seek additional medical care.  Follow-up with your doctor today.

## 2022-01-17 NOTE — ED Notes (Signed)
RT note: Pt. completed CAT nebulizer(10 mg Albuterol) at this time 0834, currently on room air 99% initially and currently 95% after ~2 minutes. At 0849 saturations 92-94% while on room air, MD/RN made aware.

## 2022-01-17 NOTE — H&P (Signed)
History and Physical    Patient: Jordan Chambers QIO:[id number] DOB: Oct 21, 1940 DOA: 01/17/2022 DOS: the patient was seen and examined on 01/17/2022 PCP: Deland Pretty, MD  Patient coming from: Home  Chief Complaint:  Chief Complaint  Patient presents with   Shortness of Breath   HPI: Jordan Chambers is a 81 y.o. male with medical history significant of gross hematuria, bladder tumor, urethral stricture, CAD, stent placement x3, throat congestion, diverticulosis, basal cell carcinoma, hyperlipidemia, hypertension, peripheral dural disease, right lower lobe nodule, COPD who is coming to the emergency department with complaints of productive cough for the past 3 days associated with dyspnea, fatigue and wheezing.  Sputum is described as thick and brownish in color.  He had a very mild sore throat last week.  He gets occasional nasal discharge in the morning and has felt sinus congestion recently.  No hemoptysis, chest pain, palpitations, diaphoresis, PND, orthopnea or pitting edema lower extremities.  No abdominal pain, nausea, emesis, diarrhea, melena or hematochezia.  No flank pain, dysuria, frequency or hematuria.  No polyuria, polydipsia, polyphagia or blurred vision.  ED course: Initial vital signs were temperature 97.7 F, pulse 74, respiration 18, BP 123/62 mmHg O2 sat 91% on room air.  While in the emergency department the patient desaturated to 86% and was placed on nasal cannula oxygen at 2 LPM.  He received 2 DuoNebs, at 10 mg continuous albuterol neb, 1 tablet of Augmentin 875, 1 tablet of doxycycline 100 mg and dexamethasone 10 mg IVP.  Lab work: CBC showed a white count of 13.6, involving 14.5 g/dL platelets 178.  Influenza and coronavirus PCR was negative.  BMP was normal except for glucose of 100 mg/dL.  Phosphorus 3.7 and magnesium 2.0 mg/dL.  Imaging: Portable 1 view chest radiograph show hyperexpansion with chronic interstitial coarsening.  No acute cardiopulmonary findings.  Review of  Systems: As mentioned in the history of present illness. All other systems reviewed and are negative.  Past Medical History:  Diagnosis Date   Bladder tumor    CAD (coronary artery disease) cardiologist-  dr croitoru    08/ 2000  Cardiac cath w/ stenting x3 (per pt. one vessel);  12/ 2008 cardiac cath w/ angioplasty for in-stent restenosis   Congestion of throat 09-05-2017  received call back from pt today 1349 , pt had seen his pcp , was told just his sinuses   09-05-2017 per pt congestion in throat , cough, occasion productive yellow, no fever (advised pt to see his pcp prior to surgery 09-07-2017)   COPD (chronic obstructive pulmonary disease) ([place])    Diverticulosis of colon    Full dentures    Gross hematuria    History of adenomatous polyp of colon    2007; 2012; 2015   History of basal cell carcinoma (BCC) excision    05/ 2014  nose   History of exercise stress test 09-08-2016   dr croitoru   no evidence of stress induced ischmeia;  per dr croitoru note did not show evidence of any new blockages, blood pressure did increase excessively w/ exercise (not unexpected since we stopped metoprolol for the test)   Hyperlipidemia    Hypertension    Peripheral arterial occlusive disease ([place])    09/ 2009  dx right SFA occlusion by dx dr Scot Dock   Pulmonary nodule    RLL in 2001   S/P coronary artery stent placement 03/1999   one vessel PCI and stents x3   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  Wears glasses    Past Surgical History:  Procedure Laterality Date   APPENDECTOMY  1971   CARDIOVASCULAR STRESS TEST  07-08-2010  dr croitoru   normal perfusion nuclear study w/ no evicende ischemia/  normal LV function and wall motion , ef 70%   COLONOSCOPY  last one 04-02-2014   CORONARY ANGIOPLASTY  12/ 2000   dr tysinger   angioplasty for in-stent restenosis   CORONARY ANGIOPLASTY WITH STENT PLACEMENT  08/ 2000  dr tysinger   stenting x3  (per pt one vessel)   CYSTOSCOPY W/ RETROGRADES  Bilateral 09/07/2017   Procedure: CYSTOSCOPY WITH BILATERAL RETROGRADE PYELOGRAM;  Surgeon: [name], MD;  Location: [facility];  Service: Urology;  Laterality: Bilateral;   CYSTOSCOPY WITH URETHRAL DILATATION N/A 09/07/2017   Procedure: CYSTOSCOPY WITH URETHRAL DILATATION;  Surgeon: [name], MD;  Location: [facility];  Service: Urology;  Laterality: N/A;   TONSILLECTOMY  1951   TRANSURETHRAL RESECTION OF BLADDER TUMOR N/A 09/07/2017   Procedure: TRANSURETHRAL RESECTION OF BLADDER TUMOR  EPIRUBICIN(TURBT);  Surgeon: [name], MD;  Location: [facility];  Service: Urology;  Laterality: N/A;   [facility]   Social History:  reports that he quit smoking about 10 years ago. His smoking use included cigarettes. He has a 40.00 pack-year smoking history. He has never used smokeless tobacco. He reports that he does not drink alcohol and does not use drugs.  Allergies  Allergen Reactions   Hydrochlorothiazide Other (See Comments)    Family History  Adopted: Yes  Family history unknown: Yes    Prior to Admission medications   Medication Sig Start Date End Date Taking? Authorizing Provider  amLODipine (NORVASC) 10 MG tablet Take 10 mg by mouth daily.  06/28/16   [provider]  amoxicillin-clavulanate (AUGMENTIN) 875-125 MG tablet Take 1 tablet by mouth every 12 (twelve) hours. 01/17/22   Luna Fuse, MD  aspirin EC 81 MG tablet Take 81 mg by mouth daily.    [provider]  Cetirizine HCl (ZYRTEC ALLERGY) 10 MG TBDP 1 tablet    [provider]  irbesartan (AVAPRO) 300 MG tablet TAKE 1 TABLET(300 MG) BY MOUTH EVERY MORNING 10/04/21   Croitoru, Mihai, MD  metoprolol succinate (TOPROL-XL) 50 MG 24 hr tablet TAKE 1 TABLET(50 MG) BY MOUTH DAILY AFTER SUPPER 03/05/21   Croitoru, Mihai, MD  predniSONE (DELTASONE) 20 MG tablet Take 1 tablet (20 mg total) by mouth 2 (two) times daily with a meal for 5  days. 01/17/22 01/22/22  Luna Fuse, MD  rosuvastatin (CRESTOR) 10 MG tablet Take 10 mg by mouth every evening.  06/28/16   [provider]    Physical Exam: Vitals:   01/17/22 1141 01/17/22 1145 01/17/22 1200 01/17/22 1230  BP:  (!) 110/56 (!) 105/55 130/61  Pulse: 74 71 71 72  Resp: 19 19 (!) 21 20  Temp:    97.7 F (36.5 C)  TempSrc:    Oral  SpO2: (!) 89% 93% 96% 95%  Weight:      Height:    '5\' 9"'$  (1.753 m)   Physical Exam Vitals and nursing note reviewed.  Constitutional:      Appearance: He is well-developed.  HENT:     Head: Normocephalic.     Mouth/Throat:     Mouth: Mucous membranes are moist.     Pharynx: Oropharynx is clear.  Eyes:     General: No scleral  icterus.    Pupils: Pupils are equal, round, and reactive to light.  Neck:     Vascular: No JVD.  Cardiovascular:     Rate and Rhythm: Normal rate and regular rhythm.     Heart sounds: S1 normal and S2 normal.  Pulmonary:     Effort: Pulmonary effort is normal.     Breath sounds: No decreased breath sounds, wheezing or rhonchi.  Abdominal:     General: Bowel sounds are normal. There is no distension.     Palpations: Abdomen is soft.     Tenderness: There is no abdominal tenderness. There is no right CVA tenderness or left CVA tenderness.  Musculoskeletal:     Cervical back: Neck supple.     Right lower leg: No edema.     Left lower leg: No edema.  Skin:    General: Skin is warm and dry.  Neurological:     General: No focal deficit present.     Mental Status: He is alert and oriented to person, place, and time.  Psychiatric:        Mood and Affect: Mood normal.        Behavior: Behavior normal.    Data Reviewed:  There are no new results to review at this time.  Assessment and Plan: Principal Problem:   Acute respiratory failure with hypoxia (HCC) In the setting of exacerbation of   COPD (chronic obstructive pulmonary disease) (HCC) Observation/telemetry Continue supplemental  oxygen. Received dexamethasone 10 mg mg IVP x1. Followed by prednisone 40 mg p.o. daily in a.m. Scheduled and as needed bronchodilators. Follow-up with pulmonology as an outpatient.  Active Problems:   HTN (hypertension) Continue amlodipine 10 mg p.o. daily. Continue Avapro 300 mg p.o. daily. Continue Toprol XL 50 mg p.o. in the evening.   Hypercholesteremia Continue rosuvastatin 10 mg p.o. daily.  CAD (coronary artery disease) (HCC) Continue aspirin, beta-blocker and statin.    PAD (peripheral artery disease) (HCC) Continue aspirin and statin.    Advance Care Planning:   Code Status: Full Code   Consults:   Family Communication:   Severity of Illness: The appropriate patient status for this patient is OBSERVATION. Observation status is judged to be reasonable and necessary in order to provide the required intensity of service to ensure the patient's safety. The patient's presenting symptoms, physical exam findings, and initial radiographic and laboratory data in the context of their medical condition is felt to place them at decreased risk for further clinical deterioration. Furthermore, it is anticipated that the patient will be medically stable for discharge from the hospital within 2 midnights of admission.   Author: Reubin Milan, MD 01/17/2022 12:44 PM  For on call review [email or web address].   This document was prepared using Par[employer] and may contain some unintended transcription errors.

## 2022-01-17 NOTE — ED Provider Notes (Addendum)
Patient signed out to me is pending repeat evaluation.  After breathing treatment, patient still requiring 2 L nasal cannula support to maintain oxygenation greater than 90%.  With any kind of exertion or ambulation it drops down to the 80% range.  Hospitalist have been consulted for admission for COPD exacerbation with acute bronchitis.  Addendum: He had taken some convincing to get the patient to be seen in the hospital.  However he later approached me and states he change his mind and prefers to go home.  Risks and benefits of admission versus leaving [place] discussed.  All questions were answered.  Patient advised of significant cardiopulmonary risk, possibility of death.  Patient appears to have decision-making capacity along with his family member at bedside.  Invited to return anytime if he changes mind and would like continued medical care.   Luna Fuse, MD 01/17/22 8889    Luna Fuse, MD 01/17/22 [phone]

## 2022-01-17 NOTE — ED Provider Notes (Addendum)
[facility] EMERGENC[facility] DEPT Provider Note   CSN: [id number] Arrival date & time: 01/17/22  0435     History  Chief Complaint  Patient presents with   Cough    Jordan Chambers is a 81 y.o. male.  HPI     This is an 81 year old male with a history of hypertension, hyperlipidemia, COPD who presents with cough.  Patient reports several day history of productive cough.  He describes thick brown sputum.  He has been using an inhaler with minimal relief.  Denies significant chest pain but does endorse some shortness of breath.  Denies lower extremity swelling.  No fevers.  No recent sick contacts.  Home Medications Prior to Admission medications   Medication Sig Start Date End Date Taking? Authorizing Provider  amLODipine (NORVASC) 10 MG tablet Take 10 mg by mouth daily.  06/28/16   [provider]  aspirin EC 81 MG tablet Take 81 mg by mouth daily.    [provider]  Cetirizine HCl (ZYRTEC ALLERGY) 10 MG TBDP 1 tablet    [provider]  irbesartan (AVAPRO) 300 MG tablet TAKE 1 TABLET(300 MG) BY MOUTH EVERY MORNING 10/04/21   Croitoru, Mihai, MD  metoprolol succinate (TOPROL-XL) 50 MG 24 hr tablet TAKE 1 TABLET(50 MG) BY MOUTH DAILY AFTER SUPPER 03/05/21   Croitoru, Mihai, MD  rosuvastatin (CRESTOR) 10 MG tablet Take 10 mg by mouth every evening.  06/28/16   [provider]      Allergies    Hydrochlorothiazide    Review of Systems   Review of Systems  Constitutional:  Negative for fever.  Respiratory:  Positive for cough and shortness of breath.   Cardiovascular:  Negative for chest pain.  All other systems reviewed and are negative.  Physical Exam Updated Vital Signs BP (!) 118/58   Pulse (!) 56   Temp (!) 97.5 F (36.4 C)   Resp 10   Ht 1.753 m ('5\' 9"'$ )   Wt 79.4 kg   SpO2 91%   BMI 25.84 kg/m  Physical Exam Vitals and nursing note reviewed.  Constitutional:      Appearance: He is well-developed.     Comments: Elderly,  chronically ill-appearing, no acute distress  HENT:     Head: Normocephalic and atraumatic.     Mouth/Throat:     Mouth: Mucous membranes are moist.  Eyes:     Pupils: Pupils are equal, round, and reactive to light.  Cardiovascular:     Rate and Rhythm: Normal rate and regular rhythm.     Heart sounds: Normal heart sounds. No murmur heard. Pulmonary:     Effort: Pulmonary effort is normal. No respiratory distress.     Breath sounds: Wheezing and rhonchi present.     Comments: Coarse breath sounds, poor air movement with an occasional wheeze Abdominal:     General: Bowel sounds are normal.     Palpations: Abdomen is soft.     Tenderness: There is no abdominal tenderness. There is no rebound.  Musculoskeletal:     Cervical back: Neck supple.     Right lower leg: No edema.  Lymphadenopathy:     Cervical: No cervical adenopathy.  Skin:    General: Skin is warm and dry.  Neurological:     Mental Status: He is alert and oriented to person, place, and time.  Psychiatric:        Mood and Affect: Mood normal.    ED Results / Procedures / Treatments   Labs (  all labs ordered are listed, but only abnormal results are displayed) Labs Reviewed  CBC WITH DIFFERENTIAL/PLATELET - Abnormal; Notable for the following components:      Result Value   WBC 13.6 (*)    Neutro Abs 10.5 (*)    Monocytes Absolute 1.2 (*)    All other components within normal limits  BASIC METABOLIC PANEL - Abnormal; Notable for the following components:   Glucose, Bld 100 (*)    All other components within normal limits  RESP PANEL BY RT-PCR (FLU A&B, COVID) [id number]    EKG EKG Interpretation  Date/Time:  Monday Jan 17 2022 04:48:26 EDT Ventricular Rate:  67 PR Interval:  198 QRS Duration: 100 QT Interval:  411 QTC Calculation: 434 R Axis:   112 Text Interpretation: Sinus rhythm Right axis deviation Low voltage, precordial leads Confirmed by Thayer Jew [phone]) on 01/17/2022 5:57:09  AM  Radiology DG Chest Portable 1 View  Result Date: 01/17/2022 CLINICAL DATA:  Productive cough. EXAM: PORTABLE CHEST 1 VIEW COMPARISON:  10/12/2020 FINDINGS: 0507 hours. Lungs are hyperexpanded. Right upper lobe pulmonary nodule is similar to exam from 07/26/2018 and was also visible on exam from 2011, consistent with benign etiology. Interstitial markings are diffusely coarsened with chronic features. The cardiopericardial silhouette is within normal limits for size. Telemetry leads overlie the chest. IMPRESSION: Stable exam. Hyperexpansion with chronic interstitial coarsening. No acute cardiopulmonary findings. Electronically Signed   By: Misty Stanley M.D.   On: 01/17/2022 05:54    Procedures Procedures    Medications Ordered in ED Medications  albuterol (PROVENTIL) (2.5 MG/3ML) 0.083% nebulizer solution 10 mg (has no administration in time range)  AeroChamber Plus Flo-Vu Medium MISC 1 each (has no administration in time range)  ipratropium-albuterol (DUONEB) 0.5-2.5 (3) MG/3ML nebulizer solution 3 mL (3 mLs Nebulization Not Given 01/17/22 0544)  dexamethasone (DECADRON) injection 10 mg (10 mg Intravenous Given 01/17/22 0619)  amoxicillin-clavulanate (AUGMENTIN) 875-125 MG per tablet 1 tablet (1 tablet Oral Given 01/17/22 8413)  doxycycline (VIBRA-TABS) tablet 100 mg (100 mg Oral Given 01/17/22 0638)  ipratropium-albuterol (DUONEB) 0.5-2.5 (3) MG/3ML nebulizer solution 3 mL (3 mLs Nebulization Given 01/17/22 2440)    ED Course/ Medical Decision Making/ A&P Clinical Course as of 01/17/22 0701  Mon Jan 17, 2022  0634 On my repeat evaluation, patient states he feels much better.  He is satting 95% on room air.  He had previously been on 2 L of nasal cannula.  I requested nursing walk him.  When nursing went in the room, O2 sats 91%.  We will give 1 additional DuoNeb and a dose of Augmentin and doxycycline to cover for pneumonia.  Patient would like to go home.  If his oxygen levels leveled out,  feel this would not be unreasonable. [CH]  0700 Patient ambulated and dropped his O2 sats.  He still would very much like to go home.  He continues to have some wheeze.  He has only been here for 2 hours.  Anticipate he may need admission given his hypoxia; however, will give continuous DuoNeb and have him reassessed by my partner.  I discussed this with the patient.  He knows that if we cannot wean his oxygen, he will need admission. [CH]    Clinical Course User Index [CH] Elea Holtzclaw, Barbette Hair, MD                           Medical Decision Making Amount and/or Complexity of Data  Reviewed Labs: ordered. Radiology: ordered.  Risk Prescription drug management.   This patient presents to the ED for concern of cough, this involves an extensive number of treatment options, and is a complaint that carries with it a high risk of complications and morbidity.  I considered the following differential and admission for this acute, potentially life threatening condition.  The differential diagnosis includes pneumonia, COPD exacerbation, CHF  MDM:    This is an 81 year old male who presents with cough and shortness of breath.  He is nontoxic.  Vital signs notable for initial blood pressure of 186/69.  Temperature 97.5.  He is in no respiratory distress but does have coarse breath sounds and wheezing.  Respiratory was administering a DuoNeb upon my arrival.  Given productivity of cough, pneumonia certainly consideration.  COPD exacerbation is also consideration.  Labs obtained.  COVID and influenza testing sent.  Patient was given a dose of Decadron for wheezing as well as a DuoNeb.  Chest x-ray was independently reviewed by myself.  No noted pneumonia by radiology although his left lower lobe appears quite hazy to me.  He has a leukocytosis to 13.  This with clinical symptoms and x-ray I feel is consistent with likely early pneumonia.  Patient given Augmentin and doxycycline.  Patient reported significant  improvement after DuoNeb.  O2 sats marginal in the low 90s.  We will give a second DuoNeb and reassess.  If a patient is able to ambulate and maintain his pulse ox, he would like to be able to go home.  (Labs, imaging, consults)  Labs: I Ordered, and personally interpreted labs.  The pertinent results include: CBC, BMP  Imaging Studies ordered: I ordered imaging studies including x-ray I independently visualized and interpreted imaging. I agree with the radiologist interpretation  Additional history obtained from chart review.  External records from outside source obtained and reviewed including prior hospitalizations  Cardiac Monitoring: The patient was maintained on a cardiac monitor.  I personally viewed and interpreted the cardiac monitored which showed an underlying rhythm of: Sinus rhythm  Reevaluation: After the interventions noted above, I reevaluated the patient and found that they have :improved  Social Determinants of Health: Elderly, lives independently  Disposition: Pending  Co morbidities that complicate the patient evaluation  Past Medical History:  Diagnosis Date   Bladder tumor    CAD (coronary artery disease) cardiologist-  dr croitoru    08/ 2000  Cardiac cath w/ stenting x3 (per pt. one vessel);  12/ 2008 cardiac cath w/ angioplasty for in-stent restenosis   Congestion of throat 09-05-2017  received call back from pt today 1349 , pt had seen his pcp , was told just his sinuses   09-05-2017 per pt congestion in throat , cough, occasion productive yellow, no fever (advised pt to see his pcp prior to surgery 09-07-2017)   COPD (chronic obstructive pulmonary disease) ([place])    Diverticulosis of colon    Full dentures    Gross hematuria    History of adenomatous polyp of colon    2007; 2012; 2015   History of basal cell carcinoma (BCC) excision    05/ 2014  nose   History of exercise stress test 09-08-2016   dr croitoru   no evidence of stress induced  ischmeia;  per dr croitoru note did not show evidence of any new blockages, blood pressure did increase excessively w/ exercise (not unexpected since we stopped metoprolol for the test)   Hyperlipidemia    Hypertension  Peripheral arterial occlusive disease ([place])    09/ 2009  dx right SFA occlusion by dx dr Scot Dock   Pulmonary nodule    RLL in 2001   S/P coronary artery stent placement 03/1999   one vessel PCI and stents x3   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    Wears glasses      Medicines Meds ordered this encounter  Medications   ipratropium-albuterol (DUONEB) 0.5-2.5 (3) MG/3ML nebulizer solution 3 mL   ipratropium-albuterol (DUONEB) 0.5-2.5 (3) MG/3ML nebulizer solution    Frederich Cha S: cabinet override   dexamethasone (DECADRON) injection 10 mg   amoxicillin-clavulanate (AUGMENTIN) 875-125 MG per tablet 1 tablet   doxycycline (VIBRA-TABS) tablet 100 mg   ipratropium-albuterol (DUONEB) 0.5-2.5 (3) MG/3ML nebulizer solution 3 mL   albuterol (PROVENTIL) (2.5 MG/3ML) 0.083% nebulizer solution 10 mg   AeroChamber Plus Flo-Vu Medium MISC 1 each    I have reviewed the patients home medicines and have made adjustments as needed  Problem List / ED Course: Problem List Items Addressed This Visit   None Visit Diagnoses     Community acquired pneumonia, unspecified laterality    -  Primary   Relevant Medications   albuterol (PROVENTIL) (2.5 MG/3ML) 0.083% nebulizer solution 10 mg                   Final Clinical Impression(s) / ED Diagnoses Final diagnoses:  Community acquired pneumonia, unspecified laterality    Rx / DC Orders ED Discharge Orders     None         Kiela Shisler, Barbette Hair, MD 01/17/22 7473    Merryl Hacker, MD 01/17/22 [phone]

## 2022-01-17 NOTE — ED Notes (Signed)
Patient refusing admission to hospital.   States he feels OK going home.  Risk of refusing admission discussed and patient states if he gets worse he will return

## 2022-01-18 ENCOUNTER — Telehealth: Payer: Self-pay | Admitting: Pulmonary Disease

## 2022-01-18 DIAGNOSIS — J9601 Acute respiratory failure with hypoxia: Secondary | ICD-10-CM | POA: Diagnosis not present

## 2022-01-18 LAB — RESPIRATORY PANEL BY PCR

## 2022-01-18 LAB — PROCALCITONIN: Procalcitonin: <0.1 ng/mL

## 2022-01-18 LAB — STREP PNEUMONIAE URINARY ANTIGEN: Strep Pneumo Urinary Antigen: NEGATIVE

## 2022-01-18 MED ORDER — MONTELUKAST SODIUM 10 MG PO TABS: 10.0000 mg | ORAL_TABLET | Freq: Every day | ORAL | 0 refills | Status: DC

## 2022-01-18 MED ORDER — MOMETASONE FURO-FORMOTEROL FUM 200-5 MCG/ACT IN AERO
2.0000 | INHALATION_SPRAY | Freq: Two times a day (BID) | RESPIRATORY_TRACT | Status: DC
Start: 2022-01-18 — End: 2022-01-18
  Filled 2022-01-18: qty 8.8

## 2022-01-18 MED ORDER — ADVAIR DISKUS 250-50 MCG/ACT IN AEPB: 1.0000 | INHALATION_SPRAY | Freq: Two times a day (BID) | RESPIRATORY_TRACT | 0 refills | Status: DC

## 2022-01-18 MED ORDER — MONTELUKAST SODIUM 10 MG PO TABS: 10.0000 mg | ORAL_TABLET | Freq: Every day | ORAL | Status: DC

## 2022-01-18 MED ORDER — PREDNISONE 10 MG PO TABS: ORAL_TABLET | ORAL | 0 refills | Status: AC

## 2022-01-18 NOTE — Telephone Encounter (Signed)
Patient scheduled for HFU on 02/01/2022 at 70 am with Dr. Erin Fulling- appointment reminder mailed to address on file- nothing further needed.

## 2022-01-18 NOTE — Plan of Care (Signed)
  Problem: Respiratory: Goal: Ability to maintain a clear airway will improve Outcome: Adequate for Discharge Goal: Levels of oxygenation will improve Outcome: Adequate for Discharge Goal: Ability to maintain adequate ventilation will improve Outcome: Adequate for Discharge

## 2022-01-18 NOTE — Telephone Encounter (Signed)
Please schedule for hospital follow up for COPD exacerbation in 10-14 days with Dr. Chase Caller, myself or an APP.  Thanks, Freda Jackson, MD [facility] Office: [phone]

## 2022-01-18 NOTE — TOC Transition Note (Signed)
Transition of Care [facility]) - CM/SW Discharge Note   Patient Details  Name: Jordan Chambers MRN: [id number] Date of Birth: [birth year]  [facility] CM/SW Contact:  Dessa Phi, RN Phone Number: 01/18/2022, 10:14 AM   Clinical Narrative:  d/c home no CM needs or orders.     Final next level of care: Home/Self Care Barriers to Discharge: No Barriers Identified   Patient Goals and CMS Choice        Discharge Placement                       Discharge Plan and Services                                     Social Determinants of Health (SDOH) Interventions     Readmission Risk Interventions     View : No data to display.

## 2022-01-18 NOTE — Discharge Summary (Signed)
Physician Discharge Summary  Jordan Chambers:[id number] DOB: [birth year] DOA: 01/17/2022  PCP: Deland Pretty, MD  Admit date: 01/17/2022 Discharge date: 01/18/2022  Admitted From: home Disposition:  home  Recommendations for Outpatient Follow-up:  Follow up with PCP in 1-2 weeks  Home Health: none Equipment/Devices: none  Discharge Condition: stable CODE STATUS: DNR Diet Orders (From admission, onward)     Start     Ordered   01/17/22 1233  Diet Heart Room service appropriate? Yes; Fluid consistency: Thin  Diet effective now       Question Answer Comment  Room service appropriate? Yes   Fluid consistency: Thin      01/17/22 1232            HPI: Per admitting MD, Jordan Chambers is a 81 y.o. male with medical history significant of gross hematuria, bladder tumor, urethral stricture, CAD, stent placement x3, throat congestion, diverticulosis, basal cell carcinoma, hyperlipidemia, hypertension, peripheral dural disease, right lower lobe nodule, COPD who is coming to the emergency department with complaints of productive cough for the past 3 days associated with dyspnea, fatigue and wheezing.  Sputum is described as thick and brownish in color.  He had a very mild sore throat last week.  He gets occasional nasal discharge in the morning and has felt sinus congestion recently.  No hemoptysis, chest pain, palpitations, diaphoresis, PND, orthopnea or pitting edema lower extremities.  No abdominal pain, nausea, emesis, diarrhea, melena or hematochezia.  No flank pain, dysuria, frequency or hematuria.  No polyuria, polydipsia, polyphagia or blurred vision.  Hospital Course / Discharge diagnoses: Principal Problem:   Acute respiratory failure with hypoxia (HCC) Active Problems:   HTN (hypertension)   COPD (chronic obstructive pulmonary disease) (HCC)   Hypercholesteremia   PAD (peripheral artery disease) (HCC)   CAD (coronary artery disease)   Principal Problem: Acute respiratory  failure with hypoxia (HCC) due to COPD exacerbation - patient was admitted to the hospital with respiratory failure, wheezing, in the setting of COPD exacerbation. He was placed on nebulizers, antibiotics, steroids and pulmonary was consulted. With supportive treatment his breathing improved, he feels back to baseline, able to ambulate on room air. His wheezing has resolved. Discussed with Pulmonary, will dc home in stable condition with outpatient follow up. He will be started on Singulair and Advair. He will be on a prednisone taper.   Active Problems: HTN (hypertension) -resume home medications Hypercholesteremia -resume home medications CAD (coronary artery disease) ([place]) -resume home medications PAD (peripheral artery disease) ([place]) -resume home medications  Sepsis ruled out   Discharge Instructions   Allergies as of 01/18/2022       Reactions   Hydrochlorothiazide Rash        Medication List     TAKE these medications    Advair Diskus 250-50 MCG/ACT Aepb Generic drug: fluticasone-salmeterol Inhale 1 puff into the lungs in the morning and at bedtime.   albuterol 108 (90 Base) MCG/ACT inhaler Commonly known as: VENTOLIN HFA Inhale 2 puffs into the lungs every 6 (six) hours as needed for shortness of breath.   amLODipine 10 MG tablet Commonly known as: NORVASC Take 10 mg by mouth daily.   amoxicillin-clavulanate 875-125 MG tablet Commonly known as: AUGMENTIN Take 1 tablet by mouth every 12 (twelve) hours.   aspirin EC 81 MG tablet Take 81 mg by mouth daily.   hydrocortisone 2.5 % cream Apply 1 application. topically daily as needed (dry skin).   irbesartan 300 MG tablet Commonly known  as: AVAPRO TAKE 1 TABLET(300 MG) BY MOUTH EVERY MORNING What changed: See the new instructions.   metoprolol succinate 50 MG 24 hr tablet Commonly known as: TOPROL-XL TAKE 1 TABLET(50 MG) BY MOUTH DAILY AFTER SUPPER What changed: See the new instructions.   montelukast 10 MG  tablet Commonly known as: SINGULAIR Take 1 tablet (10 mg total) by mouth at bedtime.   predniSONE 10 MG tablet Commonly known as: DELTASONE Take 4 tablets (40 mg total) by mouth daily for 3 days, THEN 3 tablets (30 mg total) daily for 3 days, THEN 2 tablets (20 mg total) daily for 3 days, THEN 1 tablet (10 mg total) daily for 3 days. Start taking on: Jan 18, 2022 What changed:  medication strength See the new instructions.   rosuvastatin 10 MG tablet Commonly known as: CRESTOR Take 10 mg by mouth every evening.         Consultations: Pulmonary   Procedures/Studies:  DG Chest Portable 1 View  Result Date: 01/17/2022 CLINICAL DATA:  Productive cough. EXAM: PORTABLE CHEST 1 VIEW COMPARISON:  10/12/2020 FINDINGS: 0507 hours. Lungs are hyperexpanded. Right upper lobe pulmonary nodule is similar to exam from 07/26/2018 and was also visible on exam from 2011, consistent with benign etiology. Interstitial markings are diffusely coarsened with chronic features. The cardiopericardial silhouette is within normal limits for size. Telemetry leads overlie the chest. IMPRESSION: Stable exam. Hyperexpansion with chronic interstitial coarsening. No acute cardiopulmonary findings. Electronically Signed   By: Misty Stanley M.D.   On: 01/17/2022 05:54     Subjective: - no chest pain, shortness of breath, no abdominal pain, nausea or vomiting.   Discharge Exam: BP 128/60 (BP Location: Left Arm)   Pulse 61   Temp 97.9 F (36.6 C) (Oral)   Resp 20   Ht '5\' 9"'$  (1.753 m)   Wt 81.6 kg   SpO2 96%   BMI 26.55 kg/m   General: Pt is alert, awake, not in acute distress Cardiovascular: RRR, S1/S2 +, no rubs, no gallops Respiratory: CTA bilaterally, no wheezing, no rhonchi Abdominal: Soft, NT, ND, bowel sounds + Extremities: no edema, no cyanosis    The results of significant diagnostics from this hospitalization (including imaging, microbiology, ancillary and laboratory) are listed below for  reference.     Microbiology: Recent Results (from the past 240 hour(s))  Resp Panel by RT-PCR (Flu A&B, Covid) Nasopharyngeal Swab     Status: None   Collection Time: 01/17/22  4:50 AM   Specimen: Nasopharyngeal Swab; Nasopharyngeal(NP) swabs in vial transport medium  Result Value Ref Range Status   SARS Coronavirus 2 by RT PCR NEGATIVE NEGATIVE Final    Comment: (NOTE) SARS-CoV-2 target nucleic acids are NOT DETECTED.  The SARS-CoV-2 RNA is generally detectable in upper respiratory specimens during the acute phase of infection. The lowest concentration of SARS-CoV-2 viral copies this assay can detect is 138 copies/mL. A negative result does not preclude SARS-Cov-2 infection and should not be used as the sole basis for treatment or other patient management decisions. A negative result may occur with  improper specimen collection/handling, submission of specimen other than nasopharyngeal swab, presence of viral mutation(s) within the areas targeted by this assay, and inadequate number of viral copies(<138 copies/mL). A negative result must be combined with clinical observations, patient history, and epidemiological information. The expected result is Negative.  Fact Sheet for Patients:  EntrepreneurPulse.com.au  Fact Sheet for Healthcare Providers:  IncredibleEmployment.be  This test is no t yet approved or cleared by the  [place] Architectural technologist and  has been authorized for detection and/or diagnosis of SARS-CoV-2 by FDA under an Print production planner (EUA). This EUA will remain  in effect (meaning this test can be used) for the duration of the COVID-19 declaration under Section 564(b)(1) of the Act, 21 U.S.C.section 360bbb-3(b)(1), unless the authorization is terminated  or revoked sooner.       Influenza A by PCR NEGATIVE NEGATIVE Final   Influenza B by PCR NEGATIVE NEGATIVE Final    Comment: (NOTE) The Xpert Xpress SARS-CoV-2/FLU/RSV  plus assay is intended as an aid in the diagnosis of influenza from Nasopharyngeal swab specimens and should not be used as a sole basis for treatment. Nasal washings and aspirates are unacceptable for Xpert Xpress SARS-CoV-2/FLU/RSV testing.  Fact Sheet for Patients: EntrepreneurPulse.com.au  Fact Sheet for Healthcare Providers: IncredibleEmployment.be  This test is not yet approved or cleared by the [place] FDA and has been authorized for detection and/or diagnosis of SARS-CoV-2 by FDA under an Emergency Use Authorization (EUA). This EUA will remain in effect (meaning this test can be used) for the duration of the COVID-19 declaration under Section 564(b)(1) of the Act, 21 U.S.C. section 360bbb-3(b)(1), unless the authorization is terminated or revoked.  Performed at [facility], [address] [place], [place] [address]      Labs: Basic Metabolic Panel: Recent Labs  Lab 01/17/22 0450  NA 139  K 4.0  CL 101  CO2 28  GLUCOSE 100*  BUN 22  CREATININE 0.78  CALCIUM 9.0  MG 2.0  PHOS 3.7   Liver Function Tests: No results for input(s): AST, ALT, ALKPHOS, BILITOT, PROT, ALBUMIN in the last 168 hours. CBC: Recent Labs  Lab 01/17/22 0450  WBC 13.6*  NEUTROABS 10.5*  HGB 14.5  HCT 46.1  MCV 87.0  PLT 178   CBG: No results for input(s): GLUCAP in the last 168 hours. Hgb A1c No results for input(s): HGBA1C in the last 72 hours. Lipid Profile No results for input(s): CHOL, HDL, LDLCALC, TRIG, CHOLHDL, LDLDIRECT in the last 72 hours. Thyroid function studies No results for input(s): TSH, T4TOTAL, T3FREE, THYROIDAB in the last 72 hours.  Invalid input(s): FREET3 Urinalysis    Component Value Date/Time   COLORURINE YELLOW 05/02/2010 0019   APPEARANCEUR CLEAR 05/02/2010 0019   LABSPEC 1.025 05/02/2010 0019   PHURINE 6.0 05/02/2010 0019   GLUCOSEU NEGATIVE 05/02/2010 0019   HGBUR TRACE (A)  05/02/2010 0019   BILIRUBINUR NEGATIVE 05/02/2010 0019   KETONESUR NEGATIVE 05/02/2010 0019   PROTEINUR NEGATIVE 05/02/2010 0019   UROBILINOGEN 1.0 05/02/2010 0019   NITRITE NEGATIVE 05/02/2010 0019   LEUKOCYTESUR NEGATIVE 05/02/2010 0019    FURTHER DISCHARGE INSTRUCTIONS:   Get Medicines reviewed and adjusted: Please take all your medications with you for your next visit with your Primary MD   Laboratory/radiological data: Please request your Primary MD to go over all hospital tests and procedure/radiological results at the follow up, please ask your Primary MD to get all Hospital records sent to his/her office.   In some cases, they will be blood work, cultures and biopsy results pending at the time of your discharge. Please request that your primary care M.D. goes through all the records of your hospital data and follows up on these results.   Also Note the following: If you experience worsening of your admission symptoms, develop shortness of breath, life threatening emergency, suicidal or homicidal thoughts you must seek medical attention immediately by calling 911 or calling  your MD immediately  if symptoms less severe.   You must read complete instructions/literature along with all the possible adverse reactions/side effects for all the Medicines you take and that have been prescribed to you. Take any new Medicines after you have completely understood and accpet all the possible adverse reactions/side effects.    Do not drive when taking Pain medications or sleeping medications (Benzodaizepines)   Do not take more than prescribed Pain, Sleep and Anxiety Medications. It is not advisable to combine anxiety,sleep and pain medications without talking with your primary care practitioner   Special Instructions: If you have smoked or chewed Tobacco  in the last 2 yrs please stop smoking, stop any regular Alcohol  and or any Recreational drug use.   Wear Seat belts while driving.    Please note: You were cared for by a hospitalist during your hospital stay. Once you are discharged, your primary care physician will handle any further medical issues. Please note that NO REFILLS for any discharge medications will be authorized once you are discharged, as it is imperative that you return to your primary care physician (or establish a relationship with a primary care physician if you do not have one) for your post hospital discharge needs so that they can reassess your need for medications and monitor your lab values.  Time coordinating discharge: 35 minutes  SIGNED:  Marzetta Board, MD, PhD 01/18/2022, 9:41 AM

## 2022-01-18 NOTE — Consult Note (Signed)
NAME:  Jordan Chambers, MRN:  [id number], DOB:  Jul 11, 1941, LOS: 0 ADMISSION DATE:  01/17/2022, CONSULTATION DATE:  01/18/22 REFERRING MD:  Dr. Einar Gip CHIEF COMPLAINT:  Respiratory Failure  History of Present Illness:  Jordan Chambers is an 81 year old male with coronary artery disease, hypertension, hyperlipidemia, bladder tumor and lung nodule who presented to the ER 01/17/22 with 3 days of dyspnea, fatigue and wheezing.   ED course: Initial vital signs were temperature 97.7 F, pulse 74, respiration 18, BP 123/62 mmHg O2 sat 91% on room air.  While in the emergency department the patient desaturated to 86% and was placed on nasal cannula oxygen at 2 LPM.  He received 2 DuoNebs, at 10 mg continuous albuterol neb, 1 tablet of Augmentin 875, 1 tablet of doxycycline 100 mg and dexamethasone 10 mg IVP. Chest radiograph showed hyperexpansion with chronic interstitial coarsening. CBC showed WBC 13.6.   He was admitted by the hospitalist service for acute hypoxemic respiratory failure in setting of COPD exacerbation. He has been continued on '40mg'$  prednisone daily, augmentin BID and scheduled duonebs q8hrs.   PCCM has been consulted for further evaluation.  He reports being diagnosed with COPD by Dr. Shelia Media in the past after PFT testing. He has only required albuterol as needed. He complains of seasonal allergies in the spring and fall that can affect his breathing. She complains of sinus drainage and is using astelin nasal spray. He does have exertional dyspnea but remains fairly active overall. He has a chronic cough that is intermittently productive of sputum. He smoked for 50 years, 1 pack per day. He quit smoking 15 years ago.   Pertinent  Medical History  Bladder Tumor Urethral Stricture CAD s/p stent placement x 3 Diverticulosis Basal Cell Carcinoma HLD HTN Lung Nodule  Significant Hospital Events: Including procedures, antibiotic start and stop dates in addition to other pertinent events      Interim History / Subjective:   He is on room air this morning.  He has been afebrile He reports feeling better this morning.   Objective   Blood pressure 128/60, pulse 61, temperature 97.9 F (36.6 C), temperature source Oral, resp. rate 20, height '5\' 9"'$  (1.753 m), weight 81.6 kg, SpO2 96 %.        Intake/Output Summary (Last 24 hours) at 01/18/2022 0810 Last data filed at 01/18/2022 0757 Gross per 24 hour  Intake 700 ml  Output 200 ml  Net 500 ml   Filed Weights   01/17/22 1034 01/17/22 1230  Weight: 79.4 kg 81.6 kg    Examination: General: elderly male, sitting up in chair, no acute distress HENT: [place]/AT, PERRL, moist mucous membranes Lungs: diminished air movement, no wheezing Cardiovascular: rrr, no murmurs Abdomen: soft, non-tender, non-distended, BS+ Extremities: bilateral 1+ lower extremity edema Neuro: alert and oriented x 3, moving all extremities GU: n/a  Resolved Hospital Problem list     Assessment & Plan:  Acute Hypoxemic Respiratory Failure Acute Exacerbation of COPD Former smoker, 50+ pack year smoking history Seasonal Allergies Chronic Rhinitis/sinusitis with post-nasal drainage  Plan: - Ok to discharge on extended prednisone taper: '40mg'$  x 3 days, '30mg'$  x 3 days, '20mg'$  x 3 days, '10mg'$  x 3 days - Recommend LABA/ICS maintenance inhaler at discharge either advair 250-46mg 1 puff twice daily or Breo ellipta 100-281m 1 puff daily.  - ok to complete course of augmentin - He can continue as needed albuterol - We will arrange outpatient follow up in the next 7-14 days. He  will need HRCT chest in the future to follow up chronic appearing interstitial changes noted on chest radiograph. He will also need updated PFTs.   Best Practice (right click and "Reselect all SmartList Selections" daily)   Per primary  Labs   CBC: Recent Labs  Lab 01/17/22 0450  WBC 13.6*  NEUTROABS 10.5*  HGB 14.5  HCT 46.1  MCV 87.0  PLT 628    Basic Metabolic  Panel: Recent Labs  Lab 01/17/22 0450  NA 139  K 4.0  CL 101  CO2 28  GLUCOSE 100*  BUN 22  CREATININE 0.78  CALCIUM 9.0  MG 2.0  PHOS 3.7   GFR: Estimated Creatinine Clearance: 71.2 mL/min (by C-G formula based on SCr of 0.78 mg/dL). Recent Labs  Lab 01/17/22 0450  WBC 13.6*    Liver Function Tests: No results for input(s): AST, ALT, ALKPHOS, BILITOT, PROT, ALBUMIN in the last 168 hours. No results for input(s): LIPASE, AMYLASE in the last 168 hours. No results for input(s): AMMONIA in the last 168 hours.  ABG    Component Value Date/Time   TCO2 31 09/07/2017 0940     Coagulation Profile: No results for input(s): INR, PROTIME in the last 168 hours.  Cardiac Enzymes: No results for input(s): CKTOTAL, CKMB, CKMBINDEX, TROPONINI in the last 168 hours.  HbA1C: Hgb A1c MFr Bld  Date/Time Value Ref Range Status  09/15/2018 08:32 PM 6.0 (H) 4.8 - 5.6 % Final    Comment:    (NOTE) Pre diabetes:          5.7%-6.4% Diabetes:              >6.4% Glycemic control for   <7.0% adults with diabetes     CBG: No results for input(s): GLUCAP in the last 168 hours.  Review of Systems:   Review of Systems  Constitutional:  Negative for chills, fever, malaise/fatigue and weight loss.  HENT:  Positive for congestion. Negative for sinus pain and sore throat.   Eyes: Negative.   Respiratory:  Positive for cough, sputum production, shortness of breath and wheezing. Negative for hemoptysis.   Cardiovascular:  Negative for chest pain, palpitations, orthopnea, claudication and leg swelling.  Gastrointestinal:  Negative for abdominal pain, heartburn, nausea and vomiting.  Genitourinary: Negative.   Musculoskeletal:  Negative for joint pain and myalgias.  Skin:  Negative for rash.  Neurological:  Negative for weakness.  Endo/Heme/Allergies:  Positive for environmental allergies.  Psychiatric/Behavioral: Negative.     Past Medical History:  He,  has a past medical history of  Bladder tumor, CAD (coronary artery disease) (cardiologist-  dr croitoru), Congestion of throat (09-05-2017  received call back from pt today 1349 , pt had seen his pcp , was told just his sinuses), COPD (chronic obstructive pulmonary disease) ([place]), Diverticulosis of colon, Full dentures, Gross hematuria, History of adenomatous polyp of colon, History of basal cell carcinoma (BCC) excision, History of exercise stress test (09-08-2016   dr croitoru), Hyperlipidemia, Hypertension, Peripheral arterial occlusive disease ([place]), Pulmonary nodule, S/P coronary artery stent placement (03/1999), 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, and Wears glasses.   Surgical History:   Past Surgical History:  Procedure Laterality Date   APPENDECTOMY  1971   CARDIOVASCULAR STRESS TEST  07-08-2010  dr croitoru   normal perfusion nuclear study w/ no evicende ischemia/  normal LV function and wall motion , ef 70%   COLONOSCOPY  last one 04-02-2014   CORONARY ANGIOPLASTY  12/ 2000   dr tysinger  angioplasty for in-stent restenosis   CORONARY ANGIOPLASTY WITH STENT PLACEMENT  08/ 2000  dr tysinger   stenting x3  (per pt one vessel)   CYSTOSCOPY W/ RETROGRADES Bilateral 09/07/2017   Procedure: CYSTOSCOPY WITH BILATERAL RETROGRADE PYELOGRAM;  Surgeon: [name], MD;  [facility];  Service: Urology;  Laterality: Bilateral;   CYSTOSCOPY WITH URETHRAL DILATATION N/A 09/07/2017   Procedure: CYSTOSCOPY WITH URETHRAL DILATATION;  Surgeon: [name], MD;  Location: [facility];  Service: Urology;  Laterality: N/A;   TONSILLECTOMY  1951   TRANSURETHRAL RESECTION OF BLADDER TUMOR N/A 09/07/2017   Procedure: TRANSURETHRAL RESECTION OF BLADDER TUMOR  EPIRUBICIN(TURBT);  Surgeon: [name], MD;  Location[facility];  Service: Urology;  Laterality: N/A;   [facility]     Social History:   reports that he quit smoking about 10 years ago. His smoking  use included cigarettes. He has a 40.00 pack-year smoking history. He has never used smokeless tobacco. He reports that he does not drink alcohol and does not use drugs.   Family History:  His He was adopted. Family history is unknown by patient.   Allergies Allergies  Allergen Reactions   Hydrochlorothiazide Rash     Home Medications  Prior to Admission medications   Medication Sig Start Date End Date Taking? Authorizing Provider  albuterol (VENTOLIN HFA) 108 (90 Base) MCG/ACT inhaler Inhale 2 puffs into the lungs every 6 (six) hours as needed for shortness of breath. 10/14/21  Yes [provider]  amLODipine (NORVASC) 10 MG tablet Take 10 mg by mouth daily.  06/28/16  Yes [provider]  amoxicillin-clavulanate (AUGMENTIN) 875-125 MG tablet Take 1 tablet by mouth every 12 (twelve) hours. 01/17/22  Yes Luna Fuse, MD  aspirin EC 81 MG tablet Take 81 mg by mouth daily.   Yes [provider]  hydrocortisone 2.5 % cream Apply 1 application. topically daily as needed (dry skin).   Yes [provider]  irbesartan (AVAPRO) 300 MG tablet TAKE 1 TABLET(300 MG) BY MOUTH EVERY MORNING Patient taking differently: Take 300 mg by mouth daily. 10/04/21  Yes Croitoru, Mihai, MD  metoprolol succinate (TOPROL-XL) 50 MG 24 hr tablet TAKE 1 TABLET(50 MG) BY MOUTH DAILY AFTER SUPPER Patient taking differently: Take 50 mg by mouth daily. 03/05/21  Yes Croitoru, Mihai, MD  rosuvastatin (CRESTOR) 10 MG tablet Take 10 mg by mouth every evening.  06/28/16  Yes [provider]  predniSONE (DELTASONE) 20 MG tablet Take 1 tablet (20 mg total) by mouth 2 (two) times daily with a meal for 5 days. 01/17/22 01/22/22  Luna Fuse, MD     Critical care time: n/a    Freda Jackson, MD [facility] Office: [phone]   See Amion for personal pager PCCM on call pager [phone] until 7pm. Please call Elink 7p-7a. [phone]

## 2022-01-19 LAB — LEGIONELLA PNEUMOPHILA SEROGP 1 UR AG: L. pneumophila Serogp 1 Ur Ag: NEGATIVE

## 2022-01-31 ENCOUNTER — Encounter: Payer: Medicare Other | Admitting: Hematology

## 2022-02-01 ENCOUNTER — Encounter: Payer: Self-pay | Admitting: Pulmonary Disease

## 2022-02-01 ENCOUNTER — Ambulatory Visit: Payer: Medicare Other | Admitting: Pulmonary Disease

## 2022-02-01 VITALS — BP 138/74 | HR 58 | Ht 69.0 in | Wt 185.0 lb

## 2022-02-01 DIAGNOSIS — J449 Chronic obstructive pulmonary disease, unspecified: Secondary | ICD-10-CM

## 2022-02-01 MED ORDER — MONTELUKAST SODIUM 10 MG PO TABS: 10.0000 mg | ORAL_TABLET | Freq: Every day | ORAL | 11 refills | Status: AC

## 2022-02-01 NOTE — Progress Notes (Signed)
Synopsis: Referred in June 2023 for hospital f/u for COPD exacerbation  Subjective:   PATIENT ID: Jordan Chambers GENDER: male DOB: 08-Apr-1941, MRN: [id number]  HPI  Chief Complaint  Patient presents with   Hospitalization Follow-up    Hospitalized last month for COPD. States he is feeling much better.    Jordan Chambers is an 81 year old male, former smoker with coronary artery disease, hypertension, hyperlipidemia, bladder tumor and lung nodule who is referred to pulmonary clinic for hospital follow up for COPD exacerbation.   He was admitted 5/22 to 5/23 for COPD exacerbation. He was discharged with steroid taper advair 250-76mg and course of augmentin. Prior to admission he had been using albtuerol as needed.   He is feeling much better. No wheezing or cough now. He is using Aste Pro nasal spray. He has ipratropium 0.06% at home but is not using. He has not needed PRN albuterol.  He complains of seasonal allergies in the spring and fall that can affect his breathing. She complains of sinus drainage and is using astelin nasal spray. He does have exertional dyspnea but remains fairly active overall. He has a chronic cough that is intermittently productive of sputum. He smoked for 50 years, 1 pack per day. He quit smoking 15 years ago.  Past Medical History:  Diagnosis Date   Bladder tumor    CAD (coronary artery disease) cardiologist-  dr croitoru    08/ 2000  Cardiac cath w/ stenting x3 (per pt. one vessel);  12/ 2008 cardiac cath w/ angioplasty for in-stent restenosis   Congestion of throat 09-05-2017  received call back from pt today [id number] , pt had seen his pcp , was told just his sinuses   09-05-2017 per pt congestion in throat , cough, occasion productive yellow, no fever (advised pt to see his pcp prior to surgery 09-07-2017)   [place]    Diverticulosis of colon    Full dentures    Gross hematuria    History of adenomatous polyp of colon     2007; 2012; 2015   History of basal cell carcinoma (BCC) excision    05/ 2014  nose   History of exercise stress test 09-08-2016   dr croitoru   no evidence of stress induced ischmeia;  per dr croitoru note did not show evidence of any new blockages, blood pressure did increase excessively w/ exercise (not unexpected since we stopped metoprolol for the test)   Hyperlipidemia    Hypertension    Peripheral arterial occlusive disease ([facility]    09/ 2009  dx right SFA occlusion by dx dr dScot Dock  Pulmonary nodule    RLL in 2001   S/P coronary artery stent placement 03/1999   one vessel PCI and stents x3   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    Wears glasses      Family History  Adopted: Yes  Family history unknown: Yes     Social History   Socioeconomic History   Marital status: Married    Spouse name: Not on file   Number of children: Not on file   Years of education: Not on file   Highest education level: Not on file  Occupational History   Not on file  Tobacco Use   Smoking status: Former    Packs/day: 1.00    Years: 40.00    Pack years: 40.00    Types: Cigarettes    Quit date: 09/06/2011  Years since quitting: 10.4   Smokeless tobacco: Never  Vaping Use   Vaping Use: Never used  Substance and Sexual Activity   Alcohol use: No   Drug use: No   Sexual activity: Not on file  Other Topics Concern   Not on file  Social History Narrative   Not on file   Social Determinants of Health   Financial Resource Strain: Not on file  Food Insecurity: Not on file  Transportation Needs: Not on file  Physical Activity: Not on file  Stress: Not on file  Social Connections: Not on file  Intimate Partner Violence: Not on file     Allergies  Allergen Reactions   Hydrochlorothiazide Rash     Outpatient Medications Prior to Visit  Medication Sig Dispense Refill   albuterol (VENTOLIN HFA) 108 (90 Base) MCG/ACT inhaler Inhale 2 puffs into the lungs every 6 (six) hours as needed  for shortness of breath.     amLODipine (NORVASC) 10 MG tablet Take 10 mg by mouth daily.   0   aspirin EC 81 MG tablet Take 81 mg by mouth daily.     cetirizine (ZYRTEC) 10 MG tablet Take 10 mg by mouth daily as needed for allergies.     fluticasone-salmeterol (ADVAIR DISKUS) 250-50 MCG/ACT AEPB Inhale 1 puff into the lungs in the morning and at bedtime. 60 each 0   hydrocortisone 2.5 % cream Apply 1 application. topically daily as needed (dry skin).     irbesartan (AVAPRO) 300 MG tablet TAKE 1 TABLET(300 MG) BY MOUTH EVERY MORNING (Patient taking differently: Take 300 mg by mouth daily.) 90 tablet 1   metoprolol succinate (TOPROL-XL) 50 MG 24 hr tablet TAKE 1 TABLET(50 MG) BY MOUTH DAILY AFTER SUPPER (Patient taking differently: Take 50 mg by mouth daily.) 30 tablet 11   rosuvastatin (CRESTOR) 10 MG tablet Take 10 mg by mouth every evening.   0   amoxicillin-clavulanate (AUGMENTIN) 875-125 MG tablet Take 1 tablet by mouth every 12 (twelve) hours. 14 tablet 0   montelukast (SINGULAIR) 10 MG tablet Take 1 tablet (10 mg total) by mouth at bedtime. 30 tablet 0   No facility-administered medications prior to visit.   Review of Systems  Constitutional:  Negative for chills, fever, malaise/fatigue and weight loss.  HENT:  Positive for congestion. Negative for sinus pain and sore throat.   Eyes: Negative.   Respiratory:  Negative for cough, hemoptysis, sputum production, shortness of breath and wheezing.   Cardiovascular:  Negative for chest pain, palpitations, orthopnea, claudication and leg swelling.  Gastrointestinal:  Negative for abdominal pain, heartburn, nausea and vomiting.  Genitourinary: Negative.   Musculoskeletal:  Negative for joint pain and myalgias.  Skin:  Negative for rash.  Neurological:  Negative for weakness.  Endo/Heme/Allergies:  Positive for environmental allergies.  Psychiatric/Behavioral: Negative.     Objective:   Vitals:   02/01/22 0930  BP: 138/74  Pulse: (!) 58   SpO2: 91%  Weight: 185 lb (83.9 kg)  Height: '5\' 9"'$  (1.753 m)    Physical Exam Constitutional:      General: He is not in acute distress. HENT:     Head: Normocephalic and atraumatic.  Eyes:     Extraocular Movements: Extraocular movements intact.     Conjunctiva/sclera: Conjunctivae normal.     Pupils: Pupils are equal, round, and reactive to light.  Cardiovascular:     Rate and Rhythm: Normal rate and regular rhythm.     Pulses: Normal pulses.  Heart sounds: Normal heart sounds. No murmur heard. Pulmonary:     Effort: Pulmonary effort is normal.     Breath sounds: Normal breath sounds. No wheezing.  Abdominal:     General: Bowel sounds are normal.     Palpations: Abdomen is soft.  Musculoskeletal:     Right lower leg: No edema.     Left lower leg: No edema.  Lymphadenopathy:     Cervical: No cervical adenopathy.  Skin:    General: Skin is warm and dry.  Neurological:     General: No focal deficit present.     Mental Status: He is alert.  Psychiatric:        Mood and Affect: Mood normal.        Behavior: Behavior normal.        Thought Content: Thought content normal.        Judgment: Judgment normal.   CBC    Component Value Date/Time   WBC 13.6 (H) 01/17/2022 0450   RBC 5.30 01/17/2022 0450   HGB 14.5 01/17/2022 0450   HCT 46.1 01/17/2022 0450   PLT 178 01/17/2022 0450   MCV 87.0 01/17/2022 0450   MCH 27.4 01/17/2022 0450   MCHC 31.5 01/17/2022 0450   RDW 13.4 01/17/2022 0450   LYMPHSABS 1.6 01/17/2022 0450   MONOABS 1.2 (H) 01/17/2022 0450   EOSABS 0.1 01/17/2022 0450   BASOSABS 0.0 01/17/2022 0450      Latest Ref Rng & Units 01/17/2022    4:50 AM 09/16/2018    7:21 AM 09/15/2018    5:47 PM  BMP  Glucose 70 - 99 mg/dL 100   91   174    BUN 8 - 23 mg/dL '22   20   22    '$ Creatinine 0.61 - 1.24 mg/dL 0.78   0.78   0.85    Sodium 135 - 145 mmol/L 139   137   140    Potassium 3.5 - 5.1 mmol/L 4.0   3.6   3.6    Chloride 98 - 111 mmol/L 101   100    101    CO2 22 - 32 mmol/L '28   25   27    '$ Calcium 8.9 - 10.3 mg/dL 9.0   8.5   9.4     Chest imaging: CXR 01/17/22 Stable exam. Hyperexpansion with chronic interstitial coarsening. No acute cardiopulmonary findings.   PFT:     View : No data to display.          Labs:  Path:  Echo 2020: LV EF 60-65%. Grade I diastolic dysfunction.   Heart Catheterization:  Assessment & Plan:   Chronic obstructive pulmonary disease, unspecified COPD type ([place]) - Plan: Pulmonary Function Test, montelukast (SINGULAIR) 10 MG tablet  Discussion: Lynda Labreck is an 81 year old male, former smoker with coronary artery disease, hypertension, hyperlipidemia, bladder tumor and lung nodule who is referred to pulmonary clinic for hospital follow up for COPD exacerbation.   He is to continue advair 250-91mg 1 puff twice daily and albuterol as needed.  He is to continue montelukast daily and zyrtec daily for allergies.  He can continue astepro nasal spray and I recommended he use the ipratropium nasal spray he has at home as needed for post nasal drainage.   He would like to defer pulmonary function testing.   Follow up in 6 months.   JFreda Jackson MD [facility] Office: [phone]  Current Outpatient Medications:  albuterol (VENTOLIN HFA) 108 (90 Base) MCG/ACT inhaler, Inhale 2 puffs into the lungs every 6 (six) hours as needed for shortness of breath., Disp: , Rfl:    amLODipine (NORVASC) 10 MG tablet, Take 10 mg by mouth daily. , Disp: , Rfl: 0   aspirin EC 81 MG tablet, Take 81 mg by mouth daily., Disp: , Rfl:    cetirizine (ZYRTEC) 10 MG tablet, Take 10 mg by mouth daily as needed for allergies., Disp: , Rfl:    fluticasone-salmeterol (ADVAIR DISKUS) 250-50 MCG/ACT AEPB, Inhale 1 puff into the lungs in the morning and at bedtime., Disp: 60 each, Rfl: 0   hydrocortisone 2.5 % cream, Apply 1 application. topically daily as needed (dry skin)., Disp: , Rfl:     irbesartan (AVAPRO) 300 MG tablet, TAKE 1 TABLET(300 MG) BY MOUTH EVERY MORNING (Patient taking differently: Take 300 mg by mouth daily.), Disp: 90 tablet, Rfl: 1   metoprolol succinate (TOPROL-XL) 50 MG 24 hr tablet, TAKE 1 TABLET(50 MG) BY MOUTH DAILY AFTER SUPPER (Patient taking differently: Take 50 mg by mouth daily.), Disp: 30 tablet, Rfl: 11   rosuvastatin (CRESTOR) 10 MG tablet, Take 10 mg by mouth every evening. , Disp: , Rfl: 0   montelukast (SINGULAIR) 10 MG tablet, Take 1 tablet (10 mg total) by mouth at bedtime., Disp: 30 tablet, Rfl: 11

## 2022-02-01 NOTE — Patient Instructions (Addendum)
Continue advair 1 puff twice daily - rinse mouth out after each use  Use albuterol inhaler 1-2 puff every 4-6 hours as needed  Continue to take montelukast and zyrtec for allergies  Continue to use astepro nasal spray. Can use ipratropium nasal spray every 8 hours, 2 sprays per nostril as needed.  Follow up in 6 months.

## 2022-02-09 DIAGNOSIS — Z09 Encounter for follow-up examination after completed treatment for conditions other than malignant neoplasm: Secondary | ICD-10-CM | POA: Diagnosis not present

## 2022-02-09 DIAGNOSIS — J309 Allergic rhinitis, unspecified: Secondary | ICD-10-CM | POA: Diagnosis not present

## 2022-02-09 DIAGNOSIS — J441 Chronic obstructive pulmonary disease with (acute) exacerbation: Secondary | ICD-10-CM | POA: Diagnosis not present

## 2022-03-10 ENCOUNTER — Other Ambulatory Visit: Payer: Self-pay | Admitting: Cardiovascular Disease

## 2022-03-30 ENCOUNTER — Other Ambulatory Visit: Payer: Self-pay | Admitting: Cardiovascular Disease

## 2022-04-24 ENCOUNTER — Other Ambulatory Visit: Payer: Self-pay | Admitting: Cardiovascular Disease

## 2022-05-12 ENCOUNTER — Other Ambulatory Visit: Payer: Self-pay | Admitting: Cardiovascular Disease

## 2022-05-13 ENCOUNTER — Telehealth: Payer: Self-pay | Admitting: Cardiovascular Disease

## 2022-05-13 MED ORDER — METOPROLOL SUCCINATE ER 50 MG PO TB24: 50.0000 mg | ORAL_TABLET | Freq: Every day | ORAL | 0 refills | Status: DC

## 2022-05-13 NOTE — Telephone Encounter (Signed)
*  STAT* If patient is at the pharmacy, call can be transferred to refill team.   1. Which medications need to be refilled? (please list name of each medication and dose if known)   metoprolol succinate (TOPROL-XL) 50 MG 24 hr tablet    2. Which pharmacy/location (including street and city if local pharmacy) is medication to be sent to? [facility], [place] - [address] AT [place]  3. Do they need a 30 day or 90 day supply?  90 day   Pt has scheduled appt on 08/03/22. Please advise

## 2022-08-03 ENCOUNTER — Encounter: Payer: Self-pay | Admitting: Cardiovascular Disease

## 2022-08-03 ENCOUNTER — Ambulatory Visit: Payer: Medicare Other | Attending: Cardiovascular Disease | Admitting: Cardiovascular Disease

## 2022-08-03 VITALS — BP 134/58 | HR 57 | Ht 69.0 in | Wt 189.2 lb

## 2022-08-03 DIAGNOSIS — R0602 Shortness of breath: Secondary | ICD-10-CM

## 2022-08-03 DIAGNOSIS — E7800 Pure hypercholesterolemia, unspecified: Secondary | ICD-10-CM

## 2022-08-03 DIAGNOSIS — I10 Essential (primary) hypertension: Secondary | ICD-10-CM | POA: Diagnosis not present

## 2022-08-03 DIAGNOSIS — I2510 Atherosclerotic heart disease of native coronary artery without angina pectoris: Secondary | ICD-10-CM

## 2022-08-03 DIAGNOSIS — I739 Peripheral vascular disease, unspecified: Secondary | ICD-10-CM | POA: Diagnosis not present

## 2022-08-03 DIAGNOSIS — J449 Chronic obstructive pulmonary disease, unspecified: Secondary | ICD-10-CM

## 2022-08-03 NOTE — Patient Instructions (Signed)
Medication Instructions:  Your physician recommends that you continue on your current medications as directed. Please refer to the Current Medication list given to you today.  *If you need a refill on your cardiac medications before your next appointment, please call your pharmacy*   Lab Work: None If you have labs (blood work) drawn today and your tests are completely normal, you will receive your results only by: [place] (if you have MyChart) OR A paper copy in the mail If you have any lab test that is abnormal or we need to change your treatment, we will call you to review the results.   Testing/Procedures: Your physician has requested that you have an echocardiogram. Echocardiography is a painless test that uses sound waves to create images of your heart. It provides your doctor with information about the size and shape of your heart and how well your heart's chambers and valves are working. This procedure takes approximately one hour. There are no restrictions for this procedure. Please do NOT wear cologne, perfume, aftershave, or lotions (deodorant is allowed). Please arrive 15 minutes prior to your appointment time.    Follow-Up: At [facility], you and your health needs are our priority.  As part of our continuing mission to provide you with exceptional heart care, we have created designated Provider Care Teams.  These Care Teams include your primary Cardiologist (physician) and Advanced Practice Providers (APPs -  Physician Assistants and Nurse Practitioners) who all work together to provide you with the care you need, when you need it.   Your next appointment:   1 year(s)  The format for your next appointment:   In Person  Provider:   Sanda Klein, MD   Important Information About Sugar

## 2022-08-03 NOTE — Progress Notes (Signed)
Cardiology Office Note    Date:  08/03/2022   ID:  SAHMIR WEATHERBEE, DOB [birth year], MRN [id number]  PCP:  Deland Pretty, MD  Cardiologist:   Sanda Klein, MD   Chief Complaint  Patient presents with   Shortness of Breath    History of Present Illness:  Jordan Chambers is a 81 y.o. male with a long-standing history of coronary artery disease, detected by treadmill stress testing 2000, treated with placement of 3 stents and a subsequent angioplasty for in-stent restenosis, but without any recurrent events in the last 20 years.  He has hypertension and hypercholesterolemia.  He had a normal nuclear stress test in 2011 and a normal ECG treadmill stress test in 2018.  He was hospitalized May 2023 with hypoxemic respiratory failure preceded by symptoms of acute bronchitis.  He was felt to have COPD exacerbation and was treated with antibiotics, oral steroids and a new bronchodilator prescription.  He has a follow-up visit with pulmonary function testing scheduled with Dr. Erin Fulling just after Jordan Chambers day.  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  He denies problems with chest pain at rest or with activity, but never had chest pain even when he had his coronary stents in the past.  He has not had orthopnea, PND or lower extremity edema.  He denies major weight changes.  He takes care of all his yard work, albeit slower than before.  Denies claudication.  He rarely has wheezing.  He has a rescue albuterol inhaler that he only uses once every 2 or 3 weeks.  He takes Advair daily and believes this medication has helped.  He is also taking montelukast.  He quit smoking roughly 15 years ago.  In the past, he has had intermittent swelling and discomfort in his left knee and calf with negative ultrasound for DVT, but this has not been a recent problem.  Blood pressure control is good.  Metabolic parameters are also good with a recent LDL cholesterol of 52.  In 2009 lower extremity  arterial Dopplers showed a right ABI of 0.57 (obstruction at the level of the thigh with monophasic popliteal and tibial waveforms) with a normal left ABI of 0.95.  He has not ever complained of claudication.  Last treadmill stress test was in 2018 (normal results with hypertensive response to exercise).  Last echocardiogram was in January 2020 (normal left ventricular systolic function, M amild diastolic dysfunction without any signs of elevated filling pressures or serious valvular abnormalities).  BNP was not checked at the time of his respiratory failure admission.    Past Medical History:  Diagnosis Date   Bladder tumor    CAD (coronary artery disease) cardiologist-  dr Andy Moye    08/ 2000  Cardiac cath w/ stenting x3 (per pt. one vessel);  12/ 2008 cardiac cath w/ angioplasty for in-stent restenosis   Congestion of throat 09-05-2017  received call back from pt today 1349 , pt had seen his pcp , was told just his sinuses   09-05-2017 per pt congestion in throat , cough, occasion productive yellow, no fever (advised pt to see his pcp prior to surgery 09-07-2017)   COPD (chronic obstructive pulmonary disease) ([place])    Diverticulosis of colon    Full dentures    Gross hematuria    History of adenomatous polyp of colon    2007; 2012; 2015   History of basal cell carcinoma ([place]) excision    05/ 2014  nose  History of exercise stress test 09-08-2016   dr Kiana Hollar   no evidence of stress induced ischmeia;  per dr Ahlayah Tarkowski note did not show evidence of any new blockages, blood pressure did increase excessively w/ exercise (not unexpected since we stopped metoprolol for the test)   Hyperlipidemia    Hypertension    Peripheral arterial occlusive disease [facility])    09/ 2009  dx right SFA occlusion by dx dr Scot Dock   Pulmonary nodule    RLL in 2001   S/P coronary artery stent placement 03/1999   one vessel PCI and stents x3   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    Wears glasses     Past  Surgical History:  Procedure Laterality Date   APPENDECTOMY  1971   CARDIOVASCULAR STRESS TEST  07-08-2010  dr Hardy Harcum   normal perfusion nuclear study w/ no evicende ischemia/  normal LV function and wall motion , ef 70%   COLONOSCOPY  last one 04-02-2014   CORONARY ANGIOPLASTY  12/ 2000   dr tysinger   angioplasty for in-stent restenosis   CORONARY ANGIOPLASTY WITH STENT PLACEMENT  08/ 2000  dr tysinger   stenting x3  (per pt one vessel)   CYSTOSCOPY W/ RETROGRADES Bilateral 09/07/2017   Procedure: CYSTOSCOPY WITH BILATERAL RETROGRADE PYELOGRAM;  Surgeon: [name], MD;  Location[facility];  Service: Urology;  Laterality: Bilateral;   CYSTOSCOPY WITH URETHRAL DILATATION N/A 09/07/2017   Procedure: CYSTOSCOPY WITH URETHRAL DILATATION;  Surgeon: [name], MD;  Location: [facility];  Service: Urology;  Laterality: N/A;   TONSILLECTOMY  1951   TRANSURETHRAL RESECTION OF BLADDER TUMOR N/A 09/07/2017   Procedure: TRANSURETHRAL RESECTION OF BLADDER TUMOR  EPIRUBICIN(TURBT);  Surgeon: [name], MD;  Location[facility];  Service: Urology;  Laterality: N/A;   [facility]    Current Medications: Outpatient Medications Prior to Visit  Medication Sig Dispense Refill   albuterol (VENTOLIN HFA) 108 (90 Base) MCG/ACT inhaler Inhale 2 puffs into the lungs every 6 (six) hours as needed for shortness of breath.     amLODipine (NORVASC) 10 MG tablet Take 10 mg by mouth daily.   0   aspirin EC 81 MG tablet Take 81 mg by mouth daily.     cetirizine (ZYRTEC) 10 MG tablet Take 10 mg by mouth daily as needed for allergies.     fluticasone-salmeterol (ADVAIR DISKUS) 250-50 MCG/ACT AEPB Inhale 1 puff into the lungs in the morning and at bedtime. 60 each 0   hydrocortisone 2.5 % cream Apply 1 application. topically daily as needed (dry skin).     irbesartan (AVAPRO) 300 MG tablet TAKE 1 TABLET(300 MG) BY MOUTH EVERY MORNING  (Patient taking differently: Take 300 mg by mouth daily.) 90 tablet 1   metoprolol succinate (TOPROL-XL) 50 MG 24 hr tablet Take 1 tablet (50 mg total) by mouth daily. TAKE 1 TABLET BY MOUTH DAILY AFTER SUPPER. KEEP UPCOMING APPOINTMENT FOR FUTURE REFILLS. 30 tablet 0   montelukast (SINGULAIR) 10 MG tablet Take 1 tablet (10 mg total) by mouth at bedtime. 30 tablet 11   rosuvastatin (CRESTOR) 10 MG tablet Take 10 mg by mouth every evening.   0   No facility-administered medications prior to visit.     Allergies:   Hydrochlorothiazide   Social History   Socioeconomic History   Marital status: Married    Spouse name: Not on file   Number of children: Not on  file   Years of education: Not on file   Highest education level: Not on file  Occupational History   Not on file  Tobacco Use   Smoking status: Former    Packs/day: 1.00    Years: 40.00    Total pack years: 40.00    Types: Cigarettes    Quit date: 09/06/2011    Years since quitting: 10.9   Smokeless tobacco: Never  Vaping Use   Vaping Use: Never used  Substance and Sexual Activity   Alcohol use: No   Drug use: No   Sexual activity: Not on file  Other Topics Concern   Not on file  Social History Narrative   Not on file   Social Determinants of Health   Financial Resource Strain: Not on file  Food Insecurity: Not on file  Transportation Needs: Not on file  Physical Activity: Not on file  Stress: Not on file  Social Connections: Not on file     Family History:  The patient's He was adopted. Family history is unknown by patient.   ROS:   Please see the history of present illness.    ROS All other systems reviewed and are negative.   PHYSICAL EXAM:   VS:  BP (!) 134/58 (BP Location: Left Arm, Patient Position: Sitting, Cuff Size: Normal)   Pulse (!) 57   Ht '5\' 9"'$  (1.753 m)   Wt 85.8 kg   BMI 27.94 kg/m      General: Alert, oriented x3, no distress, appears quite agile and younger than his stated age. Head:  no evidence of trauma, PERRL, EOMI, no exophtalmos or lid lag, no myxedema, no xanthelasma; normal ears, nose and oropharynx Neck: normal jugular venous pulsations and no hepatojugular reflux; brisk carotid pulses without delay and no carotid bruits Chest: clear to auscultation, no signs of consolidation by percussion or palpation, normal fremitus, symmetrical and full respiratory excursions Cardiovascular: normal position and quality of the apical impulse, regular rhythm, normal first and second heart sounds, no murmurs, rubs or gallops Abdomen: no tenderness or distention, no masses by palpation, no abnormal pulsatility or arterial bruits, normal bowel sounds, no hepatosplenomegaly Extremities: no clubbing, cyanosis or edema; 2+ radial, ulnar and brachial pulses bilaterally; 2+ right femoral, posterior tibial and dorsalis pedis pulses; 2+ left femoral, posterior tibial and dorsalis pedis pulses; no subclavian or femoral bruits Neurological: grossly nonfocal Psych: Normal mood and affect    Wt Readings from Last 3 Encounters:  08/03/22 85.8 kg  02/01/22 83.9 kg  01/17/22 81.6 kg      Studies/Labs Reviewed:   EKG:  EKG is ordered today.  Personally reviewed, the tracing shows normal sinus rhythm and overall normal findings.   Lipid Panel 2018 Total cholesterol 119, triglycerides 117, HDL 46, LDL 50, A1c 6.2%  09/27/2019 Total cholesterol 110, triglycerides 74, HDL 56, LDL 39 Normal liver function test except borderline alkaline phosphatase 127, including GGT 19.  Calcium 9.2 Potassium 5.0, creatinine 0.83, glucose 90 Hemoglobin 14.4  10/21/2020 Cholesterol 121, triglycerides 74, HDL 57, LDL 49 Hemoglobin A1c 6.0% BUN 20, creatinine 0.82  10/07/2021 Cholesterol 118, HDL 58, LDL 49, triglycerides 61  01/17/2022 Hemoglobin 14.5, creatinine 0.78, potassium 4.0 Additional studies/ records that were reviewed today include:    ASSESSMENT:    1. Coronary artery disease  involving native coronary artery of native heart without angina pectoris   2. PAD (peripheral artery disease) ([place])   3. Hypercholesteremia   4. Essential hypertension   5. Chronic obstructive  pulmonary disease, unspecified COPD type ([place])   6. SOB (shortness of breath)      PLAN:  In order of problems listed above:  CAD: Asymptomatic.  On beta-blockers, statin, aspirin.  He never really had angina pectoris so need to make sure that his current issues of shortness of breath are not related to coronary insufficiency.  Will check an echocardiogram. PAD: Remains free of claudication.  Longstanding total occlusion of the right superficial femoral artery which is asymptomatic.  No evidence of ulcerations.  The foot is warm suggesting good collateral formation. HLP: All lipid parameters in target range.  Continue rosuvastatin.   HTN: Well-controlled. COPD: Response to bronchodilators suggest that her shortness of breath is primarily due to this disorder.  Will check an echocardiogram to make sure it is not a cardiac component.  He has an appointment with Dr. Erin Fulling and pulmonary function testing scheduled for January 3.   Medication Adjustments/Labs and Tests Ordered: Current medicines are reviewed at length with the patient today.  Concerns regarding medicines are outlined above.  Medication changes, Labs and Tests ordered today are listed in the Patient Instructions below. Patient Instructions  Medication Instructions:  Your physician recommends that you continue on your current medications as directed. Please refer to the Current Medication list given to you today.  *If you need a refill on your cardiac medications before your next appointment, please call your pharmacy*   Lab Work: None If you have labs (blood work) drawn today and your tests are completely normal, you will receive your results only by: [place] (if you have MyChart) OR A paper copy in the mail If you have any  lab test that is abnormal or we need to change your treatment, we will call you to review the results.   Testing/Procedures: Your physician has requested that you have an echocardiogram. Echocardiography is a painless test that uses sound waves to create images of your heart. It provides your doctor with information about the size and shape of your heart and how well your heart's chambers and valves are working. This procedure takes approximately one hour. There are no restrictions for this procedure. Please do NOT wear cologne, perfume, aftershave, or lotions (deodorant is allowed). Please arrive 15 minutes prior to your appointment time.    Follow-Up: At [facility], you and your health needs are our priority.  As part of our continuing mission to provide you with exceptional heart care, we have created designated Provider Care Teams.  These Care Teams include your primary Cardiologist (physician) and Advanced Practice Providers (APPs -  Physician Assistants and Nurse Practitioners) who all work together to provide you with the care you need, when you need it.   Your next appointment:   1 year(s)  The format for your next appointment:   In Person  Provider:   Sanda Klein, MD   Important Information About Sugar          Signed, Sanda Klein, MD  08/03/2022 8:50 AM    [facility] [place], [place], [place]  [address] Phone: [phone]; Fax: [phone]

## 2022-08-30 ENCOUNTER — Ambulatory Visit (HOSPITAL_COMMUNITY): Payer: Medicare Other | Attending: Cardiovascular Disease

## 2022-08-30 DIAGNOSIS — R0602 Shortness of breath: Secondary | ICD-10-CM | POA: Insufficient documentation

## 2022-08-30 LAB — ECHOCARDIOGRAM COMPLETE
Area-P 1/2: 4.15 cm2
P 1/2 time: 604 msec
S' Lateral: 2.5 cm

## 2022-08-31 ENCOUNTER — Ambulatory Visit: Payer: Medicare Other | Admitting: Pulmonary Disease

## 2022-09-26 ENCOUNTER — Ambulatory Visit: Payer: Medicare Other | Admitting: Pulmonary Disease

## 2022-10-05 DIAGNOSIS — R351 Nocturia: Secondary | ICD-10-CM | POA: Diagnosis not present

## 2022-10-05 DIAGNOSIS — Z8551 Personal history of malignant neoplasm of bladder: Secondary | ICD-10-CM | POA: Diagnosis not present

## 2022-10-05 DIAGNOSIS — N35011 Post-traumatic bulbous urethral stricture: Secondary | ICD-10-CM | POA: Diagnosis not present

## 2022-10-05 DIAGNOSIS — N401 Enlarged prostate with lower urinary tract symptoms: Secondary | ICD-10-CM | POA: Diagnosis not present

## 2022-10-10 ENCOUNTER — Other Ambulatory Visit: Payer: Self-pay

## 2022-10-10 ENCOUNTER — Telehealth: Payer: Self-pay | Admitting: Cardiovascular Disease

## 2022-10-10 DIAGNOSIS — J449 Chronic obstructive pulmonary disease, unspecified: Secondary | ICD-10-CM

## 2022-10-10 NOTE — Telephone Encounter (Signed)
*  STAT* If patient is at the pharmacy, call can be transferred to refill team.   1. Which medications need to be refilled? (please list name of each medication and dose if known) metoprolol succinate (TOPROL-XL) 50 MG 24 hr tablet   2. Which pharmacy/location (including street and city if local pharmacy) is medication to be sent to?  [place], [place] - [address] AT [place]    3. Do they need a 30 day or 90 day supply? [place]

## 2022-10-11 MED ORDER — METOPROLOL SUCCINATE ER 50 MG PO TB24: 50.0000 mg | ORAL_TABLET | Freq: Every day | ORAL | 0 refills | Status: DC

## 2022-10-11 NOTE — Addendum Note (Signed)
Addended by: Orma Render on: 10/11/2022 03:15 PM   Modules accepted: Orders

## 2022-10-11 NOTE — Telephone Encounter (Signed)
Left patient voicemail for patient to call back if he has in questions on his medication.

## 2022-10-12 ENCOUNTER — Ambulatory Visit: Payer: Medicare Other | Admitting: Pulmonary Disease

## 2022-10-13 DIAGNOSIS — I10 Essential (primary) hypertension: Secondary | ICD-10-CM | POA: Diagnosis not present

## 2022-10-18 DIAGNOSIS — Z0000 Encounter for general adult medical examination without abnormal findings: Secondary | ICD-10-CM | POA: Diagnosis not present

## 2022-10-18 DIAGNOSIS — J449 Chronic obstructive pulmonary disease, unspecified: Secondary | ICD-10-CM | POA: Diagnosis not present

## 2022-10-18 DIAGNOSIS — I70209 Unspecified atherosclerosis of native arteries of extremities, unspecified extremity: Secondary | ICD-10-CM | POA: Diagnosis not present

## 2022-10-18 DIAGNOSIS — Z23 Encounter for immunization: Secondary | ICD-10-CM | POA: Diagnosis not present

## 2022-10-18 DIAGNOSIS — M1000 Idiopathic gout, unspecified site: Secondary | ICD-10-CM | POA: Diagnosis not present

## 2022-10-18 DIAGNOSIS — I2510 Atherosclerotic heart disease of native coronary artery without angina pectoris: Secondary | ICD-10-CM | POA: Diagnosis not present

## 2023-01-11 ENCOUNTER — Other Ambulatory Visit: Payer: Self-pay | Admitting: Cardiovascular Disease

## 2023-01-17 DIAGNOSIS — H35372 Puckering of macula, left eye: Secondary | ICD-10-CM | POA: Diagnosis not present

## 2023-03-22 ENCOUNTER — Ambulatory Visit: Payer: Medicare Other | Admitting: Pulmonary Disease

## 2023-03-22 NOTE — Progress Notes (Deleted)
Synopsis: Referred in June 2023 for hospital f/u for COPD exacerbation  Subjective:   PATIENT ID: Jordan Chambers GENDER: male DOB: 01-31-1941, MRN: [id number]  HPI  No chief complaint on file.  Jordan Chambers is an 82 year old male, former smoker with coronary artery disease, hypertension, hyperlipidemia, bladder tumor and lung nodule who returns to pulmonary clinic for COPD..   Initial OV 02/01/22 He was admitted 5/22 to 5/23 for COPD exacerbation. He was discharged with steroid taper advair 250-68mcg and course of augmentin. Prior to admission he had been using albtuerol as needed.   He is feeling much better. No wheezing or cough now. He is using Aste Pro nasal spray. He has ipratropium 0.06% at home but is not using. He has not needed PRN albuterol.  He complains of seasonal allergies in the spring and fall that can affect his breathing. She complains of sinus drainage and is using astelin nasal spray. He does have exertional dyspnea but remains fairly active overall. He has a chronic cough that is intermittently productive of sputum. He smoked for 50 years, 1 pack per day. He quit smoking 15 years ago.  Today - 03/22/23    Past Medical History:  Diagnosis Date   Bladder tumor    CAD (coronary artery disease) cardiologist-  dr croitoru    08/ 2000  Cardiac cath w/ stenting x3 (per pt. one vessel);  12/ 2008 cardiac cath w/ angioplasty for in-stent restenosis   Congestion of throat 09-05-2017  received call back from pt today 1349 , pt had seen his pcp , was told just his sinuses   09-05-2017 per pt congestion in throat , cough, occasion productive yellow, no fever (advised pt to see his pcp prior to surgery 09-07-2017)   COPD (chronic obstructive pulmonary disease) ([facility])    Diverticulosis of colon    Full dentures    Gross hematuria    History of adenomatous polyp of colon    2007; 2012; 2015   History of basal cell carcinoma ([facility]) excision    05/ 2014  nose   History of exercise  stress test 09-08-2016   dr croitoru   no evidence of stress induced ischmeia;  per dr croitoru note did not show evidence of any new blockages, blood pressure did increase excessively w/ exercise (not unexpected since we stopped metoprolol for the test)   Hyperlipidemia    Hypertension    Peripheral arterial occlusive disease ([facility])    09/ 2009  dx right SFA occlusion by dx dr Edilia Bo   Pulmonary nodule    RLL in 2001   S/P coronary artery stent placement 03/1999   one vessel PCI and stents x3   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    Wears glasses      Family History  Adopted: Yes  Family history unknown: Yes     Social History   Socioeconomic History   Marital status: Married    Spouse name: Not on file   Number of children: Not on file   Years of education: Not on file   Highest education level: Not on file  Occupational History   Not on file  Tobacco Use   Smoking status: Former    Current packs/day: 0.00    Average packs/day: 1 pack/day for 40.0 years (40.0 ttl pk-yrs)    Types: Cigarettes    Start date: 09/06/1971    Quit date: 09/06/2011    Years since quitting: 11.5   Smokeless tobacco: Never  Vaping Use   Vaping status: Never Used  Substance and Sexual Activity   Alcohol use: No   Drug use: No   Sexual activity: Not on file  Other Topics Concern   Not on file  Social History Narrative   Not on file   Social Determinants of Health   Financial Resource Strain: Not on file  Food Insecurity: Not on file  Transportation Needs: Not on file  Physical Activity: Not on file  Stress: Not on file  Social Connections: Not on file  Intimate Partner Violence: Not on file     Allergies  Allergen Reactions   Hydrochlorothiazide Rash     Outpatient Medications Prior to Visit  Medication Sig Dispense Refill   albuterol (VENTOLIN HFA) 108 (90 Base) MCG/ACT inhaler Inhale 2 puffs into the lungs every 6 (six) hours as needed for shortness of breath.     amLODipine  (NORVASC) 10 MG tablet Take 10 mg by mouth daily.   0   aspirin EC 81 MG tablet Take 81 mg by mouth daily.     cetirizine (ZYRTEC) 10 MG tablet Take 10 mg by mouth daily as needed for allergies.     fluticasone-salmeterol (ADVAIR DISKUS) 250-50 MCG/ACT AEPB Inhale 1 puff into the lungs in the morning and at bedtime. 60 each 0   hydrocortisone 2.5 % cream Apply 1 application. topically daily as needed (dry skin).     irbesartan (AVAPRO) 300 MG tablet TAKE 1 TABLET(300 MG) BY MOUTH EVERY MORNING (Patient taking differently: Take 300 mg by mouth daily.) 90 tablet 1   metoprolol succinate (TOPROL-XL) 50 MG 24 hr tablet TAKE 1 TABLET(50 MG) BY MOUTH DAILY AFTER SUPPER 90 tablet 0   montelukast (SINGULAIR) 10 MG tablet Take 1 tablet (10 mg total) by mouth at bedtime. 30 tablet 11   rosuvastatin (CRESTOR) 10 MG tablet Take 10 mg by mouth every evening.   0   No facility-administered medications prior to visit.   Review of Systems  Constitutional:  Negative for chills, fever, malaise/fatigue and weight loss.  HENT:  Positive for congestion. Negative for sinus pain and sore throat.   Eyes: Negative.   Respiratory:  Negative for cough, hemoptysis, sputum production, shortness of breath and wheezing.   Cardiovascular:  Negative for chest pain, palpitations, orthopnea, claudication and leg swelling.  Gastrointestinal:  Negative for abdominal pain, heartburn, nausea and vomiting.  Genitourinary: Negative.   Musculoskeletal:  Negative for joint pain and myalgias.  Skin:  Negative for rash.  Neurological:  Negative for weakness.  Endo/Heme/Allergies:  Positive for environmental allergies.  Psychiatric/Behavioral: Negative.      Objective:   There were no vitals filed for this visit.   Physical Exam Constitutional:      General: He is not in acute distress. HENT:     Head: Normocephalic and atraumatic.  Eyes:     Extraocular Movements: Extraocular movements intact.     Conjunctiva/sclera:  Conjunctivae normal.     Pupils: Pupils are equal, round, and reactive to light.  Cardiovascular:     Rate and Rhythm: Normal rate and regular rhythm.     Pulses: Normal pulses.     Heart sounds: Normal heart sounds. No murmur heard. Pulmonary:     Effort: Pulmonary effort is normal.     Breath sounds: Normal breath sounds. No wheezing.  Abdominal:     General: Bowel sounds are normal.     Palpations: Abdomen is soft.  Musculoskeletal:     Right lower  leg: No edema.     Left lower leg: No edema.  Lymphadenopathy:     Cervical: No cervical adenopathy.  Skin:    General: Skin is warm and dry.  Neurological:     General: No focal deficit present.     Mental Status: He is alert.  Psychiatric:        Mood and Affect: Mood normal.        Behavior: Behavior normal.        Thought Content: Thought content normal.        Judgment: Judgment normal.    CBC    Component Value Date/Time   WBC 13.6 (H) 01/17/2022 0450   RBC 5.30 01/17/2022 0450   HGB 14.5 01/17/2022 0450   HCT 46.1 01/17/2022 0450   PLT 178 01/17/2022 0450   MCV 87.0 01/17/2022 0450   MCH 27.4 01/17/2022 0450   MCHC 31.5 01/17/2022 0450   RDW 13.4 01/17/2022 0450   LYMPHSABS 1.6 01/17/2022 0450   MONOABS 1.2 (H) 01/17/2022 0450   EOSABS 0.1 01/17/2022 0450   BASOSABS 0.0 01/17/2022 0450      Latest Ref Rng & Units 01/17/2022    4:50 AM 09/16/2018    7:21 AM 09/15/2018    5:47 PM  BMP  Glucose 70 - 99 mg/dL 403  91  474   BUN 8 - 23 mg/dL 22  20  22    Creatinine 0.61 - 1.24 mg/dL 2.59  5.63  8.75   Sodium 135 - 145 mmol/L 139  137  140   Potassium 3.5 - 5.1 mmol/L 4.0  3.6  3.6   Chloride 98 - 111 mmol/L 101  100  101   CO2 22 - 32 mmol/L 28  25  27    Calcium 8.9 - 10.3 mg/dL 9.0  8.5  9.4    Chest imaging: CXR 01/17/22 Stable exam. Hyperexpansion with chronic interstitial coarsening. No acute cardiopulmonary findings.   PFT:     No data to display          Labs:  Path:  Echo 2020: LV EF  60-65%. Grade I diastolic dysfunction.   Heart Catheterization:  Assessment & Plan:   No diagnosis found.  Discussion: Randi Caltagirone is an 82 year old male, former smoker with coronary artery disease, hypertension, hyperlipidemia, bladder tumor and lung nodule who is referred to pulmonary clinic for hospital follow up for COPD exacerbation.   He is to continue advair 250-62mcg 1 puff twice daily and albuterol as needed.  He is to continue montelukast daily and zyrtec daily for allergies.  He can continue astepro nasal spray and I recommended he use the ipratropium nasal spray he has at home as needed for post nasal drainage.   He would like to defer pulmonary function testing.   Follow up in 6 months.   Melody Comas, MD [facility] Office: [phone]   Current Outpatient Medications:    albuterol (VENTOLIN HFA) 108 (90 Base) MCG/ACT inhaler, Inhale 2 puffs into the lungs every 6 (six) hours as needed for shortness of breath., Disp: , Rfl:    amLODipine (NORVASC) 10 MG tablet, Take 10 mg by mouth daily. , Disp: , Rfl: 0   aspirin EC 81 MG tablet, Take 81 mg by mouth daily., Disp: , Rfl:    cetirizine (ZYRTEC) 10 MG tablet, Take 10 mg by mouth daily as needed for allergies., Disp: , Rfl:    fluticasone-salmeterol (ADVAIR DISKUS) 250-50 MCG/ACT AEPB, Inhale 1  puff into the lungs in the morning and at bedtime., Disp: 60 each, Rfl: 0   hydrocortisone 2.5 % cream, Apply 1 application. topically daily as needed (dry skin)., Disp: , Rfl:    irbesartan (AVAPRO) 300 MG tablet, TAKE 1 TABLET(300 MG) BY MOUTH EVERY MORNING (Patient taking differently: Take 300 mg by mouth daily.), Disp: 90 tablet, Rfl: 1   metoprolol succinate (TOPROL-XL) 50 MG 24 hr tablet, TAKE 1 TABLET(50 MG) BY MOUTH DAILY AFTER SUPPER, Disp: 90 tablet, Rfl: 0   montelukast (SINGULAIR) 10 MG tablet, Take 1 tablet (10 mg total) by mouth at bedtime., Disp: 30 tablet, Rfl: 11   rosuvastatin (CRESTOR)  10 MG tablet, Take 10 mg by mouth every evening. , Disp: , Rfl: 0

## 2023-08-06 ENCOUNTER — Emergency Department (HOSPITAL_BASED_OUTPATIENT_CLINIC_OR_DEPARTMENT_OTHER)
Admission: EM | Admit: 2023-08-06 | Discharge: 2023-08-06 | Disposition: A | Discharge status: Home / Self Care | Payer: Medicare Other | Attending: Emergency Medicine | Admitting: Emergency Medicine

## 2023-08-06 ENCOUNTER — Other Ambulatory Visit: Payer: Self-pay

## 2023-08-06 ENCOUNTER — Encounter (HOSPITAL_BASED_OUTPATIENT_CLINIC_OR_DEPARTMENT_OTHER): Payer: Self-pay | Admitting: Emergency Medicine

## 2023-08-06 ENCOUNTER — Emergency Department (HOSPITAL_BASED_OUTPATIENT_CLINIC_OR_DEPARTMENT_OTHER): Payer: Medicare Other | Admitting: Radiology

## 2023-08-06 DIAGNOSIS — Z79899 Other long term (current) drug therapy: Secondary | ICD-10-CM | POA: Diagnosis not present

## 2023-08-06 DIAGNOSIS — R0602 Shortness of breath: Secondary | ICD-10-CM | POA: Diagnosis not present

## 2023-08-06 DIAGNOSIS — Z20822 Contact with and (suspected) exposure to covid-19: Secondary | ICD-10-CM | POA: Diagnosis not present

## 2023-08-06 DIAGNOSIS — Z7982 Long term (current) use of aspirin: Secondary | ICD-10-CM | POA: Insufficient documentation

## 2023-08-06 DIAGNOSIS — R058 Other specified cough: Secondary | ICD-10-CM | POA: Diagnosis not present

## 2023-08-06 DIAGNOSIS — J441 Chronic obstructive pulmonary disease with (acute) exacerbation: Secondary | ICD-10-CM | POA: Diagnosis not present

## 2023-08-06 LAB — RESP PANEL BY RT-PCR (RSV, FLU A&B, COVID)  RVPGX2
Influenza A by PCR: NEGATIVE
Influenza B by PCR: NEGATIVE
Resp Syncytial Virus by PCR: NEGATIVE
SARS Coronavirus 2 by RT PCR: NEGATIVE

## 2023-08-06 MED ORDER — IPRATROPIUM-ALBUTEROL 0.5-2.5 (3) MG/3ML IN SOLN: 3.0000 mL | Freq: Four times a day (QID) | RESPIRATORY_TRACT | 0 refills | Status: DC | PRN

## 2023-08-06 MED ORDER — IPRATROPIUM-ALBUTEROL 0.5-2.5 (3) MG/3ML IN SOLN
RESPIRATORY_TRACT | Status: AC
  Administered 2023-08-06: 3 mL via RESPIRATORY_TRACT
  Filled 2023-08-06: qty 3

## 2023-08-06 MED ORDER — ALBUTEROL SULFATE (2.5 MG/3ML) 0.083% IN NEBU
2.5000 mg | INHALATION_SOLUTION | Freq: Once | RESPIRATORY_TRACT | Status: DC
  Filled 2023-08-06: qty 3

## 2023-08-06 MED ORDER — ALBUTEROL SULFATE (2.5 MG/3ML) 0.083% IN NEBU: 2.5000 mg | INHALATION_SOLUTION | Freq: Once | RESPIRATORY_TRACT | Status: AC

## 2023-08-06 MED ORDER — IPRATROPIUM-ALBUTEROL 0.5-2.5 (3) MG/3ML IN SOLN
3.0000 mL | Freq: Once | RESPIRATORY_TRACT | Status: AC
  Administered 2023-08-06: 3 mL via RESPIRATORY_TRACT
  Filled 2023-08-06: qty 3

## 2023-08-06 MED ORDER — IPRATROPIUM-ALBUTEROL 0.5-2.5 (3) MG/3ML IN SOLN
3.0000 mL | Freq: Once | RESPIRATORY_TRACT | Status: AC
  Administered 2023-08-06: 3 mL via RESPIRATORY_TRACT

## 2023-08-06 MED ORDER — IPRATROPIUM-ALBUTEROL 0.5-2.5 (3) MG/3ML IN SOLN
RESPIRATORY_TRACT | Status: AC
  Filled 2023-08-06: qty 3

## 2023-08-06 MED ORDER — CIPROFLOXACIN HCL 750 MG PO TABS: 750.0000 mg | ORAL_TABLET | Freq: Two times a day (BID) | ORAL | 0 refills | Status: AC

## 2023-08-06 MED ORDER — ALBUTEROL SULFATE (2.5 MG/3ML) 0.083% IN NEBU
2.5000 mg | INHALATION_SOLUTION | Freq: Once | RESPIRATORY_TRACT | Status: AC
  Administered 2023-08-06: 2.5 mg via RESPIRATORY_TRACT

## 2023-08-06 MED ORDER — IPRATROPIUM-ALBUTEROL 0.5-2.5 (3) MG/3ML IN SOLN: 3.0000 mL | Freq: Once | RESPIRATORY_TRACT | Status: AC

## 2023-08-06 MED ORDER — METHYLPREDNISOLONE SODIUM SUCC 125 MG IJ SOLR
125.0000 mg | Freq: Once | INTRAMUSCULAR | Status: AC
  Administered 2023-08-06: 125 mg via INTRAVENOUS
  Filled 2023-08-06: qty 2

## 2023-08-06 MED ORDER — PREDNISONE 20 MG PO TABS: 40.0000 mg | ORAL_TABLET | Freq: Every day | ORAL | 0 refills | Status: AC

## 2023-08-06 MED ORDER — ALBUTEROL SULFATE (2.5 MG/3ML) 0.083% IN NEBU
INHALATION_SOLUTION | RESPIRATORY_TRACT | Status: AC
  Administered 2023-08-06: 2.5 mg via RESPIRATORY_TRACT
  Filled 2023-08-06: qty 3

## 2023-08-06 MED ORDER — ALBUTEROL SULFATE (2.5 MG/3ML) 0.083% IN NEBU
INHALATION_SOLUTION | RESPIRATORY_TRACT | Status: AC
  Filled 2023-08-06: qty 3

## 2023-08-06 NOTE — ED Triage Notes (Signed)
Drainage down throat, cough.congestion, has lost his voice,very congested cough, has COPD

## 2023-08-06 NOTE — ED Provider Notes (Signed)
[place] EMERGENCY DEPARTMENT AT [facility] Provider Note   CSN: [id number] Arrival date & time: 08/06/23  1113     History  Chief Complaint  Patient presents with   Cough    Bexton T Exume is a 82 y.o. male, history of COPD, who presents to the ED secondary to shortness of breath, cough, hoarse voice that is been going on for the last 4 days.  He states he has been coughing a lot, has had a productive cough, and has been using his inhalers as prescribed.  Notes that he feels little bit short of breath especially when walking.  Denies any chest pain.  Has not been using his albuterol inhaler.  Denies any fevers or chills.     Home Medications Prior to Admission medications   Medication Sig Start Date End Date Taking? Authorizing Provider  ciprofloxacin (CIPRO) 750 MG tablet Take 1 tablet (750 mg total) by mouth every 12 (twelve) hours for 7 days. 08/06/23 08/13/23 Yes Sharalee Witman L, PA  ipratropium-albuterol (DUONEB) 0.5-2.5 (3) MG/3ML SOLN Take 3 mLs by nebulization every 6 (six) hours as needed. 08/06/23  Yes Anja Neuzil L, PA  predniSONE (DELTASONE) 20 MG tablet Take 2 tablets (40 mg total) by mouth daily for 5 days. 08/06/23 08/11/23 Yes Constance Hackenberg L, PA  albuterol (VENTOLIN HFA) 108 (90 Base) MCG/ACT inhaler Inhale 2 puffs into the lungs every 6 (six) hours as needed for shortness of breath. 10/14/21   [provider]  amLODipine (NORVASC) 10 MG tablet Take 10 mg by mouth daily.  06/28/16   [provider]  aspirin EC 81 MG tablet Take 81 mg by mouth daily.    [provider]  cetirizine (ZYRTEC) 10 MG tablet Take 10 mg by mouth daily as needed for allergies.    [provider]  fluticasone-salmeterol (ADVAIR DISKUS) 250-50 MCG/ACT AEPB Inhale 1 puff into the lungs in the morning and at bedtime. 01/18/22 08/03/22  Leatha Gilding, MD  hydrocortisone 2.5 % cream Apply 1 application. topically daily as needed (dry skin).    [provider]  irbesartan (AVAPRO) 300 MG tablet TAKE 1 TABLET(300 MG) BY MOUTH EVERY MORNING Patient taking differently: Take 300 mg by mouth daily. 10/04/21   Croitoru, Mihai, MD  metoprolol succinate (TOPROL-XL) 50 MG 24 hr tablet TAKE 1 TABLET(50 MG) BY MOUTH DAILY AFTER SUPPER 01/11/23   Croitoru, Mihai, MD  montelukast (SINGULAIR) 10 MG tablet Take 1 tablet (10 mg total) by mouth at bedtime. 02/01/22   Martina Sinner, MD  rosuvastatin (CRESTOR) 10 MG tablet Take 10 mg by mouth every evening.  06/28/16   [provider]      Allergies    Hydrochlorothiazide    Review of Systems   Review of Systems  Respiratory:  Positive for shortness of breath.   Cardiovascular:  Positive for chest pain. Negative for leg swelling.    Physical Exam Updated Vital Signs BP (!) 138/56   Pulse 81   Temp 97.9 F (36.6 C)   Resp (!) 27   Wt 86.2 kg   SpO2 92%   BMI 28.06 kg/m  Physical Exam Vitals and nursing note reviewed.  Constitutional:      General: He is not in acute distress.    Appearance: He is well-developed.  HENT:     Head: Normocephalic and atraumatic.  Eyes:     Conjunctiva/sclera: Conjunctivae normal.  Cardiovascular:     Rate and Rhythm: Normal rate and  regular rhythm.     Heart sounds: No murmur heard. Pulmonary:     Effort: Pulmonary effort is normal. No tachypnea, accessory muscle usage or respiratory distress.     Breath sounds: Wheezing present.  Abdominal:     Palpations: Abdomen is soft.     Tenderness: There is no abdominal tenderness.  Musculoskeletal:        General: No swelling.     Cervical back: Neck supple.  Skin:    General: Skin is warm and dry.     Capillary Refill: Capillary refill takes less than 2 seconds.  Neurological:     Mental Status: He is alert.  Psychiatric:        Mood and Affect: Mood normal.     ED Results / Procedures / Treatments   Labs (all labs ordered are listed, but only abnormal results are displayed) Labs  Reviewed  RESP PANEL BY RT-PCR (RSV, FLU A&B, COVID)  [id number]    EKG None  Radiology DG Chest 2 View  Result Date: 08/06/2023 CLINICAL DATA:  Shortness of breath, productive cough EXAM: CHEST - 2 VIEW COMPARISON:  01/17/2022 FINDINGS: The heart size and mediastinal contours are within normal limits. Both lungs are clear. Disc degenerative disease of the thoracic spine. IMPRESSION: No acute abnormality of the lungs. Electronically Signed   By: Jearld Lesch M.D.   On: 08/06/2023 14:38    Procedures Procedures    Medications Ordered in ED Medications  ipratropium-albuterol (DUONEB) 0.5-2.5 (3) MG/3ML nebulizer solution (  Not Given 08/06/23 1225)  albuterol (PROVENTIL) (2.5 MG/3ML) 0.083% nebulizer solution 2.5 mg ( Nebulization Not Given 08/06/23 1225)  ipratropium-albuterol (DUONEB) 0.5-2.5 (3) MG/3ML nebulizer solution 3 mL (3 mLs Nebulization Given 08/06/23 1140)  albuterol (PROVENTIL) (2.5 MG/3ML) 0.083% nebulizer solution 2.5 mg (2.5 mg Nebulization Given 08/06/23 1139)  methylPREDNISolone sodium succinate (SOLU-MEDROL) 125 mg/2 mL injection 125 mg (125 mg Intravenous Given 08/06/23 1246)  ipratropium-albuterol (DUONEB) 0.5-2.5 (3) MG/3ML nebulizer solution 3 mL (3 mLs Nebulization Given 08/06/23 1219)  albuterol (PROVENTIL) (2.5 MG/3ML) 0.083% nebulizer solution 2.5 mg (2.5 mg Nebulization Given 08/06/23 1218)  ipratropium-albuterol (DUONEB) 0.5-2.5 (3) MG/3ML nebulizer solution 3 mL (3 mLs Nebulization Given 08/06/23 1257)    ED Course/ Medical Decision Making/ A&P                                 Medical Decision Making Patient is an 82 year old male, here for shortness of breath, productive cough, this been going on for the last 4 days.  States that he is not have any chest pain, has been using his maintenance medications as prescribed.  He has diffuse wheezing, will give DuoNeb, and further steroids, to help with this, as well as obtain a chest x-ray, COVID/flu, to further eval.  He  is not tachypneic, on my eval, and not using accessory muscles.   Amount and/or Complexity of Data Reviewed Labs:     Details: COVID/flu negative Radiology: ordered.    Details: Chest x-ray clear Discussion of management or test interpretation with external provider(s): Discussed with patient he is feeling much better after repeat DuoNebs, inhalers, he has much improved breath sounds, COVID/flu negative, chest x-ray is clear.  He is well-appearing.  He is not tachypneic, or tachycardic currently.  I believe that this likely represents a flare of his COPD, he is feeling better after the steroids and inhalers.  Will prescribe him ciprofloxacin, for COPD flare,  as well as steroids, and DuoNebs for home.  Risk Prescription drug management.   Final Clinical Impression(s) / ED Diagnoses Final diagnoses:  COPD with acute exacerbation (HCC)    Rx / DC Orders ED Discharge Orders          Ordered    predniSONE (DELTASONE) 20 MG tablet  Daily        08/06/23 1446    ciprofloxacin (CIPRO) 750 MG tablet  Every 12 hours        08/06/23 1446    ipratropium-albuterol (DUONEB) 0.5-2.5 (3) MG/3ML SOLN  Every 6 hours PRN        08/06/23 1447              Lenda Baratta L, PA 08/06/23 1452    Anders Simmonds T, DO 08/06/23 1821

## 2023-08-06 NOTE — Discharge Instructions (Addendum)
Please follow-up with your primary care doctor, I am suspicious that you are having a COPD flare, use the DuoNebs, as needed for your nebulizer, and take your inhalers as instructed.  I prescribed you an antibiotic to help with your COPD flare, as well as some steroids.  Make sure you take the steroids in the morning, did not disrupt your sleep.  If you have worsening shortness of breath, chest pain please return to the ER

## 2023-08-09 DIAGNOSIS — J441 Chronic obstructive pulmonary disease with (acute) exacerbation: Secondary | ICD-10-CM | POA: Diagnosis not present

## 2023-08-09 DIAGNOSIS — Z23 Encounter for immunization: Secondary | ICD-10-CM | POA: Diagnosis not present

## 2023-08-16 DIAGNOSIS — Z23 Encounter for immunization: Secondary | ICD-10-CM | POA: Diagnosis not present

## 2023-08-18 ENCOUNTER — Telehealth: Payer: Self-pay | Admitting: Pulmonary Disease

## 2023-10-05 DIAGNOSIS — Z96641 Presence of right artificial hip joint: Secondary | ICD-10-CM | POA: Diagnosis not present

## 2023-10-10 ENCOUNTER — Ambulatory Visit: Payer: Medicare Other | Admitting: Pulmonary Disease

## 2023-10-10 DIAGNOSIS — J449 Chronic obstructive pulmonary disease, unspecified: Secondary | ICD-10-CM | POA: Diagnosis not present

## 2023-10-10 LAB — PULMONARY FUNCTION TEST
DL/VA % pred: 59 %
DL/VA: 2.27 ml/min/mmHg/L
DLCO cor % pred: 33 %
DLCO cor: 8.39 ml/min/mmHg
DLCO unc % pred: 33 %
DLCO unc: 8.39 ml/min/mmHg
FEF 25-75 Post: 0.46 L/s
FEF 25-75 Pre: 0.36 L/s
FEF2575-%Change-Post: 27 %
FEF2575-%Pred-Post: 23 %
FEF2575-%Pred-Pre: 18 %
FEV1-%Change-Post: 9 %
FEV1-%Pred-Post: 32 %
FEV1-%Pred-Pre: 29 %
FEV1-Post: 0.96 L
FEV1-Pre: 0.88 L
FEV1FVC-%Change-Post: 0 %
FEV1FVC-%Pred-Pre: 65 %
FEV6-%Change-Post: 9 %
FEV6-%Pred-Post: 52 %
FEV6-%Pred-Pre: 47 %
FEV6-Post: 2.04 L
FEV6-Pre: 1.86 L
FEV6FVC-%Change-Post: 0 %
FEV6FVC-%Pred-Post: 104 %
FEV6FVC-%Pred-Pre: 104 %
FVC-%Change-Post: 9 %
FVC-%Pred-Post: 49 %
FVC-%Pred-Pre: 45 %
FVC-Post: 2.1 L
FVC-Pre: 1.91 L
Post FEV1/FVC ratio: 46 %
Post FEV6/FVC ratio: 97 %
Pre FEV1/FVC ratio: 46 %
Pre FEV6/FVC Ratio: 97 %
RV % pred: 191 %
RV: 5.47 L
TLC % pred: 103 %
TLC: 7.77 L

## 2023-10-10 NOTE — Progress Notes (Signed)
Full PFT performed today.

## 2023-10-10 NOTE — Patient Instructions (Signed)
Full PFT performed today.

## 2023-10-19 DIAGNOSIS — R7309 Other abnormal glucose: Secondary | ICD-10-CM | POA: Diagnosis not present

## 2023-10-19 DIAGNOSIS — I10 Essential (primary) hypertension: Secondary | ICD-10-CM | POA: Diagnosis not present

## 2023-10-19 DIAGNOSIS — I2510 Atherosclerotic heart disease of native coronary artery without angina pectoris: Secondary | ICD-10-CM | POA: Diagnosis not present

## 2023-10-19 LAB — LAB REPORT - SCANNED
A1c: 6.3
EGFR: 86

## 2023-10-23 ENCOUNTER — Ambulatory Visit: Payer: Medicare Other | Admitting: Pulmonary Disease

## 2023-10-23 DIAGNOSIS — Z0000 Encounter for general adult medical examination without abnormal findings: Secondary | ICD-10-CM | POA: Diagnosis not present

## 2023-11-01 DIAGNOSIS — R3912 Poor urinary stream: Secondary | ICD-10-CM | POA: Diagnosis not present

## 2023-11-01 DIAGNOSIS — Z8551 Personal history of malignant neoplasm of bladder: Secondary | ICD-10-CM | POA: Diagnosis not present

## 2023-11-01 DIAGNOSIS — N35011 Post-traumatic bulbous urethral stricture: Secondary | ICD-10-CM | POA: Diagnosis not present

## 2023-11-01 DIAGNOSIS — N401 Enlarged prostate with lower urinary tract symptoms: Secondary | ICD-10-CM | POA: Diagnosis not present

## 2023-12-04 ENCOUNTER — Encounter: Payer: Self-pay | Admitting: Pulmonary Disease

## 2023-12-04 ENCOUNTER — Ambulatory Visit: Payer: Medicare Other | Admitting: Pulmonary Disease

## 2023-12-04 VITALS — BP 150/86 | HR 64 | Ht 69.0 in | Wt 199.0 lb

## 2023-12-04 DIAGNOSIS — J449 Chronic obstructive pulmonary disease, unspecified: Secondary | ICD-10-CM | POA: Diagnosis not present

## 2023-12-04 DIAGNOSIS — R0982 Postnasal drip: Secondary | ICD-10-CM | POA: Diagnosis not present

## 2023-12-04 MED ORDER — CETIRIZINE HCL 10 MG PO TABS: 10.0000 mg | ORAL_TABLET | Freq: Every day | ORAL | 6 refills | Status: AC | PRN

## 2023-12-04 MED ORDER — TRELEGY ELLIPTA 100-62.5-25 MCG/ACT IN AEPB
1.0000 | INHALATION_SPRAY | Freq: Every day | RESPIRATORY_TRACT | 11 refills | Status: AC
Start: 2023-12-04 — End: ?

## 2023-12-04 MED ORDER — FLUTICASONE PROPIONATE 50 MCG/ACT NA SUSP: 1.0000 | Freq: Every day | NASAL | 2 refills | Status: DC

## 2023-12-04 NOTE — Progress Notes (Signed)
 Synopsis: Referred in June 2023 for hospital f/u for COPD exacerbation  Subjective:   PATIENT ID: Jordan Chambers GENDER: male DOB: 08-03-1941, MRN: [id number]  HPI  Chief Complaint  Patient presents with   Follow-up   Jordan Chambers is an 83 year old male, former smoker with coronary artery disease, hypertension, hyperlipidemia, bladder tumor and lung nodule who returns to pulmonary clinic for COPD.  OV 02/01/22 He was admitted 5/22 to 5/23 for COPD exacerbation. He was discharged with steroid taper advair 250-57mcg and course of augmentin. Prior to admission he had been using albtuerol as needed.   He is feeling much better. No wheezing or cough now. He is using Aste Pro nasal spray. He has ipratropium 0.06% at home but is not using. He has not needed PRN albuterol.  He complains of seasonal allergies in the spring and fall that can affect his breathing. She complains of sinus drainage and is using astelin nasal spray. He does have exertional dyspnea but remains fairly active overall. He has a chronic cough that is intermittently productive of sputum. He smoked for 50 years, 1 pack per day. He quit smoking 15 years ago.  OV 12/04/23 His breathing has been stable since switching to Trelegy, which he finds more effective than Advair. He experiences minimal mucus production, primarily in the morning, which he attributes to sinus drainage. He rarely uses his albuterol inhaler, approximately once a year.  He takes montelukast daily and uses Trelegy one puff a day.   He mentions experiencing increased sinus drainage since the onset of spring pollen. He does not currently use Flonase or any nasal spray. He has not been using Zyrtec recently.  He has been engaging in regular physical activity using an exercise machine for 30 minutes daily, equating to walking a couple of miles. He notes improvement in his shortness of breath with this exercise regimen, although he experiences shortness of breath with  heavy physical activities like using a chainsaw.  He discusses his social activities, including fishing and walking his dog, and notes that he is still able to engage in these activities despite his age. His wife is concerned about his safety during these activities.  Past Medical History:  Diagnosis Date   Bladder tumor    CAD (coronary artery disease) cardiologist-  dr croitoru    08/ 2000  Cardiac cath w/ stenting x3 (per pt. one vessel);  12/ 2008 cardiac cath w/ angioplasty for in-stent restenosis   Congestion of throat 09-05-2017  received call back from pt today 1349 , pt had seen his pcp , was told just his sinuses   09-05-2017 per pt congestion in throat , cough, occasion productive yellow, no fever (advised pt to see his pcp prior to surgery 09-07-2017)   COPD (chronic obstructive pulmonary disease) (HCC)    Diverticulosis of colon    Full dentures    Gross hematuria    History of adenomatous polyp of colon    2007; 2012; 2015   History of basal cell carcinoma (BCC) excision    05/ 2014  nose   History of exercise stress test 09-08-2016   dr croitoru   no evidence of stress induced ischmeia;  per dr croitoru note did not show evidence of any new blockages, blood pressure did increase excessively w/ exercise (not unexpected since we stopped metoprolol for the test)   Hyperlipidemia    Hypertension    Peripheral arterial occlusive disease (HCC)    09/ 2009  dx  right SFA occlusion by dx dr Edilia Bo   Pulmonary nodule    RLL in 2001   S/P coronary artery stent placement 03/1999   one vessel PCI and stents x3   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    Wears glasses      Family History  Adopted: Yes  Family history unknown: Yes     Social History   Socioeconomic History   Marital status: Married    Spouse name: Not on file   Number of children: Not on file   Years of education: Not on file   Highest education level: Not on file  Occupational History   Not on file   Tobacco Use   Smoking status: Former    Current packs/day: 0.00    Average packs/day: 1 pack/day for 40.0 years (40.0 ttl pk-yrs)    Types: Cigarettes    Start date: 09/06/1971    Quit date: 09/06/2011    Years since quitting: 12.2   Smokeless tobacco: Never  Vaping Use   Vaping status: Never Used  Substance and Sexual Activity   Alcohol use: No   Drug use: No   Sexual activity: Not on file  Other Topics Concern   Not on file  Social History Narrative   Not on file   Social Drivers of Health   Financial Resource Strain: Not on file  Food Insecurity: Not on file  Transportation Needs: Not on file  Physical Activity: Not on file  Stress: Not on file  Social Connections: Not on file  Intimate Partner Violence: Not on file     Allergies  Allergen Reactions   Hydrochlorothiazide Rash     Outpatient Medications Prior to Visit  Medication Sig Dispense Refill   albuterol (VENTOLIN HFA) 108 (90 Base) MCG/ACT inhaler Inhale 2 puffs into the lungs every 6 (six) hours as needed for shortness of breath.     amLODipine (NORVASC) 10 MG tablet Take 10 mg by mouth daily.   0   aspirin EC 81 MG tablet Take 81 mg by mouth daily.     irbesartan (AVAPRO) 300 MG tablet TAKE 1 TABLET(300 MG) BY MOUTH EVERY MORNING (Patient taking differently: Take 300 mg by mouth daily.) 90 tablet 1   metoprolol succinate (TOPROL-XL) 50 MG 24 hr tablet TAKE 1 TABLET(50 MG) BY MOUTH DAILY AFTER SUPPER 90 tablet 0   montelukast (SINGULAIR) 10 MG tablet Take 1 tablet (10 mg total) by mouth at bedtime. 30 tablet 11   rosuvastatin (CRESTOR) 10 MG tablet Take 10 mg by mouth every evening.   0   cetirizine (ZYRTEC) 10 MG tablet Take 10 mg by mouth daily as needed for allergies.     fluticasone-salmeterol (ADVAIR DISKUS) 250-50 MCG/ACT AEPB Inhale 1 puff into the lungs in the morning and at bedtime. 60 each 0   hydrocortisone 2.5 % cream Apply 1 application. topically daily as needed (dry skin). (Patient not taking:  Reported on 12/04/2023)     ipratropium-albuterol (DUONEB) 0.5-2.5 (3) MG/3ML SOLN Take 3 mLs by nebulization every 6 (six) hours as needed. (Patient not taking: Reported on 12/04/2023) 360 mL 0   TRELEGY ELLIPTA 100-62.5-25 MCG/ACT AEPB Take 1 puff by mouth daily.     No facility-administered medications prior to visit.   Review of Systems  Constitutional:  Negative for chills, fever, malaise/fatigue and weight loss.  HENT:  Positive for congestion. Negative for sinus pain and sore throat.   Eyes: Negative.   Respiratory:  Negative for cough, hemoptysis,  sputum production, shortness of breath and wheezing.   Cardiovascular:  Negative for chest pain, palpitations, orthopnea, claudication and leg swelling.  Gastrointestinal:  Negative for abdominal pain, heartburn, nausea and vomiting.  Genitourinary: Negative.   Musculoskeletal:  Negative for joint pain and myalgias.  Skin:  Negative for rash.  Neurological:  Negative for weakness.  Endo/Heme/Allergies:  Positive for environmental allergies.  Psychiatric/Behavioral: Negative.      Objective:   Vitals:   12/04/23 0833  BP: (!) 150/86  Pulse: 64  SpO2: 92%  Weight: 199 lb (90.3 kg)  Height: 5\' 9"  (1.753 m)   Physical Exam Constitutional:      General: He is not in acute distress. HENT:     Head: Normocephalic and atraumatic.  Eyes:     Conjunctiva/sclera: Conjunctivae normal.  Cardiovascular:     Rate and Rhythm: Normal rate and regular rhythm.     Pulses: Normal pulses.     Heart sounds: Normal heart sounds. No murmur heard. Pulmonary:     Effort: Pulmonary effort is normal.     Breath sounds: Normal breath sounds. No wheezing.  Musculoskeletal:     Right lower leg: No edema.     Left lower leg: No edema.  Skin:    General: Skin is warm and dry.  Neurological:     General: No focal deficit present.     Mental Status: He is alert.    CBC    Component Value Date/Time   WBC 13.6 (H) 01/17/2022 0450   RBC 5.30  01/17/2022 0450   HGB 14.5 01/17/2022 0450   HCT 46.1 01/17/2022 0450   PLT 178 01/17/2022 0450   MCV 87.0 01/17/2022 0450   MCH 27.4 01/17/2022 0450   MCHC 31.5 01/17/2022 0450   RDW 13.4 01/17/2022 0450   LYMPHSABS 1.6 01/17/2022 0450   MONOABS 1.2 (H) 01/17/2022 0450   EOSABS 0.1 01/17/2022 0450   BASOSABS 0.0 01/17/2022 0450      Latest Ref Rng & Units 01/17/2022    4:50 AM 09/16/2018    7:21 AM 09/15/2018    5:47 PM  BMP  Glucose 70 - 99 mg/dL 562  91  130   BUN 8 - 23 mg/dL 22  20  22    Creatinine 0.61 - 1.24 mg/dL 8.65  7.84  6.96   Sodium 135 - 145 mmol/L 139  137  140   Potassium 3.5 - 5.1 mmol/L 4.0  3.6  3.6   Chloride 98 - 111 mmol/L 101  100  101   CO2 22 - 32 mmol/L 28  25  27    Calcium 8.9 - 10.3 mg/dL 9.0  8.5  9.4    Chest imaging: CXR 01/17/22 Stable exam. Hyperexpansion with chronic interstitial coarsening. No acute cardiopulmonary findings.   PFT:    Latest Ref Rng & Units 10/10/2023    3:17 PM  PFT Results  FVC-Pre L 1.91   FVC-Predicted Pre % 45   FVC-Post L 2.10   FVC-Predicted Post % 49   Pre FEV1/FVC % % 46   Post FEV1/FCV % % 46   FEV1-Pre L 0.88   FEV1-Predicted Pre % 29   FEV1-Post L 0.96   DLCO uncorrected ml/min/mmHg 8.39   DLCO UNC% % 33   DLCO corrected ml/min/mmHg 8.39   DLCO COR %Predicted % 33   DLVA Predicted % 59   TLC L 7.77   TLC % Predicted % 103   RV % Predicted % 191  Labs:  Path:  Echo 2020: LV EF 60-65%. Grade I diastolic dysfunction.   Heart Catheterization:  Assessment & Plan:   Post-nasal drip - Plan: cetirizine (ZYRTEC) 10 MG tablet, fluticasone (FLONASE) 50 MCG/ACT nasal spray  Chronic obstructive pulmonary disease, unspecified COPD type (HCC) - Plan: TRELEGY ELLIPTA 100-62.5-25 MCG/ACT AEPB  Discussion: Jordan Chambers is an 83 year old male, former smoker with coronary artery disease, hypertension, hyperlipidemia, bladder tumor and lung nodule who returns to pulmonary clinic for COPD.   Chronic  Obstructive Pulmonary Disease (COPD) - Continue Trelegy, one puff daily. - Provide refills for Trelegy. - Defer overnight oxygen test.  Allergic Rhinitis - Restart Zyrtec during allergy season. - Prescribe Flonase, one spray per nostril per day, during allergy season. - Advise to discontinue Zyrtec and Flonase post-allergy season. - Discuss potential Flonase side effect of nasal dryness and epistaxis; advise to reduce use if this occurs.  Follow up in 1 year.   Melody Comas, MD [facility] Office: [phone]   Current Outpatient Medications:    albuterol (VENTOLIN HFA) 108 (90 Base) MCG/ACT inhaler, Inhale 2 puffs into the lungs every 6 (six) hours as needed for shortness of breath., Disp: , Rfl:    amLODipine (NORVASC) 10 MG tablet, Take 10 mg by mouth daily. , Disp: , Rfl: 0   aspirin EC 81 MG tablet, Take 81 mg by mouth daily., Disp: , Rfl:    fluticasone (FLONASE) 50 MCG/ACT nasal spray, Place 1 spray into both nostrils daily., Disp: 16 g, Rfl: 2   irbesartan (AVAPRO) 300 MG tablet, TAKE 1 TABLET(300 MG) BY MOUTH EVERY MORNING (Patient taking differently: Take 300 mg by mouth daily.), Disp: 90 tablet, Rfl: 1   metoprolol succinate (TOPROL-XL) 50 MG 24 hr tablet, TAKE 1 TABLET(50 MG) BY MOUTH DAILY AFTER SUPPER, Disp: 90 tablet, Rfl: 0   montelukast (SINGULAIR) 10 MG tablet, Take 1 tablet (10 mg total) by mouth at bedtime., Disp: 30 tablet, Rfl: 11   rosuvastatin (CRESTOR) 10 MG tablet, Take 10 mg by mouth every evening. , Disp: , Rfl: 0   cetirizine (ZYRTEC) 10 MG tablet, Take 1 tablet (10 mg total) by mouth daily as needed for allergies., Disp: 30 tablet, Rfl: 6   TRELEGY ELLIPTA 100-62.5-25 MCG/ACT AEPB, Take 1 puff by mouth daily., Disp: 28 each, Rfl: 11

## 2023-12-04 NOTE — Patient Instructions (Addendum)
 For Allergies: - Start zyrtec daily - start flonase nasal spray, 1 spray per nostril daily - continue montelukast daily  Continue trelegy inhaler 1 puff daily - rinse mouth out after each use  Use albuterol inhaler 1-2 puffs every 4-6 hours as needed  Follow up in 1 year, call sooner if needed

## 2024-01-17 DIAGNOSIS — J449 Chronic obstructive pulmonary disease, unspecified: Secondary | ICD-10-CM | POA: Diagnosis not present

## 2024-02-01 DIAGNOSIS — L719 Rosacea, unspecified: Secondary | ICD-10-CM | POA: Diagnosis not present

## 2024-02-01 DIAGNOSIS — L821 Other seborrheic keratosis: Secondary | ICD-10-CM | POA: Diagnosis not present

## 2024-02-01 DIAGNOSIS — L820 Inflamed seborrheic keratosis: Secondary | ICD-10-CM | POA: Diagnosis not present

## 2024-02-27 DIAGNOSIS — N401 Enlarged prostate with lower urinary tract symptoms: Secondary | ICD-10-CM | POA: Diagnosis not present

## 2024-02-27 DIAGNOSIS — R300 Dysuria: Secondary | ICD-10-CM | POA: Diagnosis not present

## 2024-02-27 DIAGNOSIS — R351 Nocturia: Secondary | ICD-10-CM | POA: Diagnosis not present

## 2024-04-15 ENCOUNTER — Other Ambulatory Visit: Payer: Self-pay

## 2024-04-15 ENCOUNTER — Emergency Department (HOSPITAL_BASED_OUTPATIENT_CLINIC_OR_DEPARTMENT_OTHER): Admitting: Radiology

## 2024-04-15 ENCOUNTER — Encounter (HOSPITAL_BASED_OUTPATIENT_CLINIC_OR_DEPARTMENT_OTHER): Payer: Self-pay | Admitting: Emergency Medicine

## 2024-04-15 ENCOUNTER — Emergency Department (HOSPITAL_BASED_OUTPATIENT_CLINIC_OR_DEPARTMENT_OTHER)
Admission: EM | Admit: 2024-04-15 | Discharge: 2024-04-16 | Disposition: A | Discharge status: Home / Self Care | Attending: Emergency Medicine | Admitting: Emergency Medicine

## 2024-04-15 DIAGNOSIS — J449 Chronic obstructive pulmonary disease, unspecified: Secondary | ICD-10-CM | POA: Diagnosis not present

## 2024-04-15 DIAGNOSIS — H43822 Vitreomacular adhesion, left eye: Secondary | ICD-10-CM | POA: Diagnosis not present

## 2024-04-15 DIAGNOSIS — R0602 Shortness of breath: Secondary | ICD-10-CM | POA: Diagnosis not present

## 2024-04-15 DIAGNOSIS — Z7982 Long term (current) use of aspirin: Secondary | ICD-10-CM | POA: Diagnosis not present

## 2024-04-15 DIAGNOSIS — H5713 Ocular pain, bilateral: Secondary | ICD-10-CM | POA: Insufficient documentation

## 2024-04-15 LAB — RESP PANEL BY RT-PCR (RSV, FLU A&B, COVID)  RVPGX2
Influenza A by PCR: NEGATIVE
Influenza B by PCR: NEGATIVE
Resp Syncytial Virus by PCR: NEGATIVE
SARS Coronavirus 2 by RT PCR: NEGATIVE

## 2024-04-15 MED ORDER — FLUORESCEIN SODIUM 1 MG OP STRP
1.0000 | ORAL_STRIP | Freq: Once | OPHTHALMIC | Status: AC
  Administered 2024-04-15: 1 via OPHTHALMIC
  Filled 2024-04-15: qty 1

## 2024-04-15 MED ORDER — TETRACAINE HCL 0.5 % OP SOLN
1.0000 [drp] | Freq: Once | OPHTHALMIC | Status: AC
  Administered 2024-04-15: 1 [drp] via OPHTHALMIC
  Filled 2024-04-15: qty 4

## 2024-04-15 MED ORDER — HYPROMELLOSE (GONIOSCOPIC) 2.5 % OP SOLN: 1.0000 [drp] | Freq: Three times a day (TID) | OPHTHALMIC | 12 refills | Status: AC

## 2024-04-15 NOTE — ED Provider Notes (Signed)
  [place] EMERGENCY DEPARTMENT AT [facility] Provider Note   CSN: [id number] Arrival date & time: 04/15/24  2042     Patient presents with: Shortness of Breath   Jordan Chambers is a 83 y.o. male.  {Add pertinent medical, surgical, social history, OB history to HPI:32947}  Shortness of Breath      Prior to Admission medications   Medication Sig Start Date End Date Taking? Authorizing Provider  albuterol  (VENTOLIN  HFA) 108 (90 Base) MCG/ACT inhaler Inhale 2 puffs into the lungs every 6 (six) hours as needed for shortness of breath. 10/14/21   [provider]  amLODipine  (NORVASC ) 10 MG tablet Take 10 mg by mouth daily.  06/28/16   [provider]  aspirin  EC 81 MG tablet Take 81 mg by mouth daily.    [provider]  cetirizine  (ZYRTEC ) 10 MG tablet Take 1 tablet (10 mg total) by mouth daily as needed for allergies. 12/04/23   Kara Dorn NOVAK, MD  fluticasone  (FLONASE ) 50 MCG/ACT nasal spray Place 1 spray into both nostrils daily. 12/04/23   Kara Dorn NOVAK, MD  irbesartan  (AVAPRO ) 300 MG tablet TAKE 1 TABLET(300 MG) BY MOUTH EVERY MORNING Patient taking differently: Take 300 mg by mouth daily. 10/04/21   Croitoru, Mihai, MD  metoprolol  succinate (TOPROL -XL) 50 MG 24 hr tablet TAKE 1 TABLET(50 MG) BY MOUTH DAILY AFTER SUPPER 01/11/23   Croitoru, Mihai, MD  montelukast  (SINGULAIR ) 10 MG tablet Take 1 tablet (10 mg total) by mouth at bedtime. 02/01/22   Kara Dorn NOVAK, MD  rosuvastatin  (CRESTOR ) 10 MG tablet Take 10 mg by mouth every evening.  06/28/16   [provider]  TRELEGY ELLIPTA  100-62.5-25 MCG/ACT AEPB Take 1 puff by mouth daily. 12/04/23   Kara Dorn NOVAK, MD    Allergies: Hydrochlorothiazide    Review of Systems  Respiratory:  Positive for shortness of breath.     Updated Vital Signs BP (!) 179/75 (BP Location: Right Arm)   Pulse 72   Temp 97.7 F (36.5 C) (Oral)   Resp (!) 23   SpO2 92%   Physical Exam  (all labs  ordered are listed, but only abnormal results are displayed) Labs Reviewed - No data to display  EKG: None  Radiology: No results found.  {Document cardiac monitor, telemetry assessment procedure when appropriate:32947} Procedures   Medications Ordered in the ED - No data to display    {Click here for ABCD2, HEART and other calculators REFRESH Note before signing:1}                              Medical Decision Making Amount and/or Complexity of Data Reviewed Radiology: ordered.  Risk Prescription drug management.   ***  {Document critical care time when appropriate  Document review of labs and clinical decision tools ie CHADS2VASC2, etc  Document your independent review of radiology images and any outside records  Document your discussion with family members, caretakers and with consultants  Document social determinants of health affecting pt's care  Document your decision making why or why not admission, treatments were needed:32947:::1}   Final diagnoses:  None    ED Discharge Orders     None

## 2024-04-15 NOTE — ED Triage Notes (Signed)
 Sob ( a little worse than normal)  neck pain/ headache Started around 8pm Denies chest pain   Was seen at eye doctor today and had some issues after dilalation

## 2024-04-15 NOTE — Discharge Instructions (Addendum)
 Follow-up with your optometrist for recheck tomorrow.  Your workup in the emergency department was reassuring.

## 2024-04-16 NOTE — ED Provider Notes (Incomplete)
 [place] EMERGENCY DEPARTMENT AT [facility] Provider Note   CSN: [id number] Arrival date & time: 04/15/24  2042     Patient presents with: Shortness of Breath   Jordan Chambers is a 83 y.o. male.  {Add pertinent medical, surgical, social history, OB history to HPI:32947}  Shortness of Breath    83 year old male presenting to the emergency department with a chief complaint of eye pain.  Had reported shortness of breath in triage, declines this to me.  States he has a history of COPD.  He went to his optometrist today and had a dilated eye exam.  He subsequently developed eye pain bilaterally with redness and tearing.  He denies any history of glaucoma, denies any trauma to the eyes.  Denies shortness of breath and chest pain to me.  No fevers, chills, cough.  Denies vision loss.  Prior to Admission medications   Medication Sig Start Date End Date Taking? Authorizing Provider  albuterol  (VENTOLIN  HFA) 108 (90 Base) MCG/ACT inhaler Inhale 2 puffs into the lungs every 6 (six) hours as needed for shortness of breath. 10/14/21   [provider]  amLODipine  (NORVASC ) 10 MG tablet Take 10 mg by mouth daily.  06/28/16   [provider]  aspirin  EC 81 MG tablet Take 81 mg by mouth daily.    [provider]  cetirizine  (ZYRTEC ) 10 MG tablet Take 1 tablet (10 mg total) by mouth daily as needed for allergies. 12/04/23   Kara Dorn NOVAK, MD  fluticasone  (FLONASE ) 50 MCG/ACT nasal spray Place 1 spray into both nostrils daily. 12/04/23   Kara Dorn NOVAK, MD  irbesartan  (AVAPRO ) 300 MG tablet TAKE 1 TABLET(300 MG) BY MOUTH EVERY MORNING Patient taking differently: Take 300 mg by mouth daily. 10/04/21   Croitoru, Mihai, MD  metoprolol  succinate (TOPROL -XL) 50 MG 24 hr tablet TAKE 1 TABLET(50 MG) BY MOUTH DAILY AFTER SUPPER 01/11/23   Croitoru, Mihai, MD  montelukast  (SINGULAIR ) 10 MG tablet Take 1 tablet (10 mg total) by mouth at bedtime. 02/01/22   Kara Dorn NOVAK, MD   rosuvastatin  (CRESTOR ) 10 MG tablet Take 10 mg by mouth every evening.  06/28/16   [provider]  TRELEGY ELLIPTA  100-62.5-25 MCG/ACT AEPB Take 1 puff by mouth daily. 12/04/23   Kara Dorn NOVAK, MD    Allergies: Hydrochlorothiazide    Review of Systems  Eyes:  Positive for pain, discharge and redness.  All other systems reviewed and are negative.   Updated Vital Signs BP (!) 179/75 (BP Location: Right Arm)   Pulse 72   Temp 97.7 F (36.5 C) (Oral)   Resp (!) 23   SpO2 92%   Physical Exam Vitals and nursing note reviewed.  Constitutional:      General: He is not in acute distress. HENT:     Head: Normocephalic and atraumatic.  Eyes:     General: Vision grossly intact. No visual field deficit.       Right eye: No foreign body.        Left eye: No foreign body.     Intraocular pressure: Right eye pressure is 18 mmHg. Left eye pressure is 18 mmHg. Measurements were taken using a handheld tonometer.    Extraocular Movements: Extraocular movements intact.     Conjunctiva/sclera: Conjunctivae normal.     Pupils: Pupils are equal, round, and reactive to light.     Right eye: No corneal abrasion or fluorescein  uptake. Seidel exam negative.     Left eye: No  corneal abrasion or fluorescein  uptake. Seidel exam negative.    Comments: Eyes tearing bilaterally, mildly erythematous bilaterally with conjunctival injection  Cardiovascular:     Rate and Rhythm: Normal rate and regular rhythm.  Pulmonary:     Effort: Pulmonary effort is normal. No respiratory distress.     Breath sounds: Normal breath sounds.  Abdominal:     General: There is no distension.     Tenderness: There is no guarding.  Musculoskeletal:        General: No deformity or signs of injury.     Cervical back: Neck supple.  Skin:    Findings: No lesion or rash.  Neurological:     General: No focal deficit present.     Mental Status: He is alert. Mental status is at baseline.     (all labs ordered  are listed, but only abnormal results are displayed) Labs Reviewed - No data to display  EKG: None  Radiology: No results found.  {Document cardiac monitor, telemetry assessment procedure when appropriate:32947} Procedures   Medications Ordered in the ED - No data to display    {Click here for ABCD2, HEART and other calculators REFRESH Note before signing:1}                              Medical Decision Making Amount and/or Complexity of Data Reviewed Radiology: ordered.  Risk OTC drugs. Prescription drug management.   ***  {Document critical care time when appropriate  Document review of labs and clinical decision tools ie CHADS2VASC2, etc  Document your independent review of radiology images and any outside records  Document your discussion with family members, caretakers and with consultants  Document social determinants of health affecting pt's care  Document your decision making why or why not admission, treatments were needed:32947:::1}   Final diagnoses:  None    ED Discharge Orders     None

## 2024-04-22 DIAGNOSIS — R7303 Prediabetes: Secondary | ICD-10-CM | POA: Diagnosis not present

## 2024-09-16 ENCOUNTER — Other Ambulatory Visit: Payer: Self-pay | Admitting: Pulmonary Disease

## 2024-09-16 DIAGNOSIS — R0982 Postnasal drip: Secondary | ICD-10-CM
